# Patient Record
Sex: Female | Born: 1990 | Race: White | Hispanic: No | Marital: Single | Smoking: Former smoker
Health system: Southern US, Community
[De-identification: ages and names within clinical notes are randomized; demographics above are authoritative.]

## PROBLEM LIST (undated history)

## (undated) HISTORY — PX: TYMPANOSTOMY TUBE PLACEMENT: SHX32

---
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
## 2011-01-13 ENCOUNTER — Encounter: Payer: Self-pay | Admitting: Family Medicine

## 2011-01-13 ENCOUNTER — Ambulatory Visit (INDEPENDENT_AMBULATORY_CARE_PROVIDER_SITE_OTHER): Payer: Managed Care, Other (non HMO) | Admitting: Family Medicine

## 2011-01-13 DIAGNOSIS — T148XXA Other injury of unspecified body region, initial encounter: Secondary | ICD-10-CM

## 2011-01-13 DIAGNOSIS — W540XXA Bitten by dog, initial encounter: Secondary | ICD-10-CM

## 2011-01-14 ENCOUNTER — Encounter: Payer: Self-pay | Admitting: Family Medicine

## 2011-01-16 ENCOUNTER — Telehealth (INDEPENDENT_AMBULATORY_CARE_PROVIDER_SITE_OTHER): Payer: Self-pay

## 2011-01-18 ENCOUNTER — Encounter: Payer: Self-pay | Admitting: Family Medicine

## 2011-01-19 NOTE — Assessment & Plan Note (Signed)
Summary: DOG BITE/WSE   Vital Signs:  Patient Profile:   20 Years Old Female CC:      dog bite to LFA x tonight Height:     63 inches Weight:      134 pounds O2 Sat:      100 % O2 treatment:    Room Air Temp:     98.7 degrees F oral Pulse rate:   106 / minute Resp:     18 per minute BP sitting:   146 / 107  (right arm) Cuff size:   regular  Vitals Entered By: Lajean Saver RN (January 13, 2011 7:52 PM)                  Updated Prior Medication List: NUVARING 0.12-0.015 MG/24HR RING (ETONOGESTREL-ETHINYL ESTRADIOL) once monthly  Current Allergies: No known allergies History of Present Illness Chief Complaint: dog bite to LFA x tonight History of Present Illness:  Subjective:  Patient complains of dog bite to left forearm this afternoon.  Dog was not sick, and has current rabies vaccination.  Patient's last tetanus shot was about 3 years ago.  REVIEW OF SYSTEMS Constitutional Symptoms      Denies fever, chills, night sweats, weight loss, weight gain, and fatigue.  Eyes       Denies change in vision, eye pain, eye discharge, glasses, contact lenses, and eye surgery. Ear/Nose/Throat/Mouth       Denies hearing loss/aids, change in hearing, ear pain, ear discharge, dizziness, frequent runny nose, frequent nose bleeds, sinus problems, sore throat, hoarseness, and tooth pain or bleeding.  Respiratory       Denies dry cough, productive cough, wheezing, shortness of breath, asthma, bronchitis, and emphysema/COPD.  Cardiovascular       Denies murmurs, chest pain, and tires easily with exhertion.    Gastrointestinal       Denies stomach pain, nausea/vomiting, diarrhea, constipation, blood in bowel movements, and indigestion. Genitourniary       Denies painful urination, kidney stones, and loss of urinary control. Neurological       Denies paralysis, seizures, and fainting/blackouts. Musculoskeletal       Denies muscle pain, joint pain, joint stiffness, decreased range of  motion, redness, swelling, muscle weakness, and gout.  Skin       Denies bruising, unusual mles/lumps or sores, and hair/skin or nail changes.  Psych       Denies mood changes, temper/anger issues, anxiety/stress, speech problems, depression, and sleep problems. Other Comments: Patient was bitten by a dog tonight on her LFA. Small puncture wound present, bleeding has stopped. Dog owner say the dog is up to date on his rabies and the patient had a TDaP 2 years ago.   Past History:  Past Medical History: Unremarkable  Past Surgical History: adenoids  Family History: Family History Hypertension  Social History: Occupation: Consulting civil engineer Single Current Smoker Alcohol use-no Drug use-no Smoking Status:  current Drug Use:  no   Objective:  Appearance:  Patient appears healthy, stated age, and in no acute distress  Left forearm:  8mm simple superficial laceration dorsal surface.  Mild surrounding tenderness.  Wound clean without debris.  Distal neurovascular intact  Assessment New Problems: LACERATION (ICD-879.8) DOG BITE (ICD-E906.0)   Plan New Medications/Changes: AUGMENTIN 875-125 MG TABS (AMOXICILLIN-POT CLAVULANATE) One by mouth q12hr pc  #20 x 0, 01/13/2011, Donna Christen MD  New Orders: New Patient Level III [id number] Repair Superficial Wound(s) 2.5cm or <(scalp,neck,axillae,ext gent,trunk,extre) [12001] Planning Comments:   Begin Augmentin.  Keep wound clean and dry; change bandage daily.  Return for signs of infection. Suture removal 10 days.   The patient and/or caregiver has been counseled thoroughly with regard to medications prescribed including dosage, schedule, interactions, rationale for use, and possible side effects and they verbalize understanding.  Diagnoses and expected course of recovery discussed and will return if not improved as expected or if the condition worsens. Patient and/or caregiver verbalized understanding.   PROCEDURE:  Suture Site: Left  forearm Size: 8mm Number of Lacerations: One Anesthesia: 1% Lidocaine without epinephrine Procedure: Procedure:  Laceration Repair Discussed benefits and risks of procedure and verbal consent obtained. Using sterile technique and local 1% lidocaine without epinephrine, cleansed wound with Betadine followed by copious lavage with normal saline.  Wound carefully inspected for debris and foreign bodies; none found.  Wound closed loosely with #1 , 4-0 interrupted nylon sutures.  Bacitracin and non-stick sterile dressing applied.  Wound precautions explained to patient.    Disposition: Home Prescriptions: AUGMENTIN 875-125 MG TABS (AMOXICILLIN-POT CLAVULANATE) One by mouth q12hr pc  #20 x 0   Entered and Authorized by:   Donna Christen MD   Signed by:   Donna Christen MD on 01/13/2011   Method used:   Print then Give to Patient   RxID:   [id number]   Orders Added: 1)  New Patient Level III [40102] 2)  Repair Superficial Wound(s) 2.5cm or <(scalp,neck,axillae,ext gent,trunk,extre) [12001]

## 2011-01-19 NOTE — Miscellaneous (Signed)
Summary: Immunizations  Immunizations   Imported By: Haskell Riling 01/14/2011 08:09:04  _____________________________________________________________________  External Attachment:    Type:   Image     Comment:   External Document

## 2011-01-19 NOTE — Letter (Signed)
Summary: Out of Work  [facility]  [address]   [place], [place] [address]   Phone: [phone]  Fax: [phone]    January 13, 2011   Employee:  DELLAR TRABER    To Whom It May Concern:   For Medical reasons, please excuse the above named employee from using her left arm for one week.      If you need additional information, please feel free to contact our office.         Sincerely,    Donna Christen MD

## 2011-01-19 NOTE — Miscellaneous (Signed)
Summary: Immunizations  Immunizations   Imported By: Haskell Riling 01/14/2011 08:11:13  _____________________________________________________________________  External Attachment:    Type:   Image     Comment:   External Document

## 2011-01-23 ENCOUNTER — Encounter: Payer: Self-pay | Admitting: Family Medicine

## 2011-01-23 ENCOUNTER — Inpatient Hospital Stay (INDEPENDENT_AMBULATORY_CARE_PROVIDER_SITE_OTHER)
Admission: RE | Admit: 2011-01-23 | Discharge: 2011-01-23 | Disposition: A | Discharge status: Home / Self Care | Payer: Commercial Indemnity | Source: Ambulatory Visit | Attending: Family Medicine | Admitting: Family Medicine

## 2011-01-23 DIAGNOSIS — T148XXA Other injury of unspecified body region, initial encounter: Secondary | ICD-10-CM

## 2011-01-23 DIAGNOSIS — Z4802 Encounter for removal of sutures: Secondary | ICD-10-CM

## 2011-01-23 DIAGNOSIS — W540XXA Bitten by dog, initial encounter: Secondary | ICD-10-CM

## 2011-01-26 NOTE — Assessment & Plan Note (Signed)
Summary: suture removal/TM      Subjective:  Patient returns for suture removal.  No complaints  Objective: Left forearm:  laceration shows no signs of infection, well healed  Assessment:  ENCOUNTER FOR REMOVAL OF SUTURES (ICD-V58.32)   Plan:   Suture removed.  Applied Bacitracin and bandaid.  Apply bandage daily for one to two days.    Current Allergies: No known allergies  Assessment  Assessed LACERATION as improved - Donna Christen MD New Problems: ENCOUNTER FOR REMOVAL OF SUTURES (ICD-V58.32)   The patient and/or caregiver has been counseled thoroughly with regard to medications prescribed including dosage, schedule, interactions, rationale for use, and possible side effects and they verbalize understanding.  Diagnoses and expected course of recovery discussed and will return if not improved as expected or if the condition worsens. Patient and/or caregiver verbalized understanding.   Orders Added: 1)  No Charge Patient Arrived (NCPA0) [NCPA0]

## 2011-01-26 NOTE — Letter (Signed)
Summary: ANIMAL BITE FORM  ANIMAL BITE FORM   Imported By: Tawni Carnes 01/18/2011 18:44:10  _____________________________________________________________________  External Attachment:    Type:   Image     Comment:   External Document

## 2011-01-26 NOTE — Progress Notes (Signed)
  Phone Note Outgoing Call   Call placed by: Linton Flemings RN,  January 16, 2011 4:03 PM Call placed to: Patient Summary of Call: called with no answer and unable to leave message

## 2011-11-11 ENCOUNTER — Ambulatory Visit: Payer: Managed Care, Other (non HMO)

## 2011-11-11 DIAGNOSIS — R059 Cough, unspecified: Secondary | ICD-10-CM

## 2011-11-11 DIAGNOSIS — H6990 Unspecified Eustachian tube disorder, unspecified ear: Secondary | ICD-10-CM

## 2011-11-11 DIAGNOSIS — H66009 Acute suppurative otitis media without spontaneous rupture of ear drum, unspecified ear: Secondary | ICD-10-CM

## 2011-11-11 DIAGNOSIS — R05 Cough: Secondary | ICD-10-CM

## 2011-11-11 DIAGNOSIS — H6980 Other specified disorders of Eustachian tube, unspecified ear: Secondary | ICD-10-CM

## 2011-12-18 ENCOUNTER — Encounter: Payer: Self-pay | Admitting: Family Medicine

## 2011-12-18 ENCOUNTER — Ambulatory Visit (INDEPENDENT_AMBULATORY_CARE_PROVIDER_SITE_OTHER): Payer: Managed Care, Other (non HMO) | Admitting: Family Medicine

## 2011-12-18 DIAGNOSIS — H66019 Acute suppurative otitis media with spontaneous rupture of ear drum, unspecified ear: Secondary | ICD-10-CM

## 2011-12-18 DIAGNOSIS — IMO0001 Reserved for inherently not codable concepts without codable children: Secondary | ICD-10-CM

## 2011-12-18 DIAGNOSIS — H6690 Otitis media, unspecified, unspecified ear: Secondary | ICD-10-CM

## 2011-12-18 MED ORDER — AMOXICILLIN 875 MG PO TABS: 875.0000 mg | ORAL_TABLET | Freq: Two times a day (BID) | ORAL | Status: AC

## 2011-12-18 MED ORDER — MOMETASONE FUROATE 50 MCG/ACT NA SUSP: 2.0000 | Freq: Every day | NASAL | Status: DC

## 2011-12-18 MED ORDER — HYDROCODONE-HOMATROPINE 5-1.5 MG/5ML PO SYRP: 5.0000 mL | ORAL_SOLUTION | Freq: Four times a day (QID) | ORAL | Status: AC | PRN

## 2011-12-18 NOTE — Progress Notes (Signed)
This is a 21 year old woman with a half a week of progressive cough congestion sore throat and fever. She works at [employer]. Both ears feel clogged. She has a history of recurrent otitis media.  Objective: No acute distress, alert and cooperative. Skin dry and warm.  HEENT: Erythematous posterior pharynx, retracted and distorted TMs bilaterally.  Neck: Supple no adenopathy  Chest: Scattered rhonchi  Heart: regular no murmur    Assessment: Acute upper respiratory infection with otitis media and cough

## 2012-01-22 ENCOUNTER — Ambulatory Visit: Payer: Managed Care, Other (non HMO) | Admitting: Family Medicine

## 2012-01-22 VITALS — BP 121/83 | HR 96 | Temp 97.8°F | Resp 16 | Ht 63.0 in | Wt 124.0 lb

## 2012-01-22 DIAGNOSIS — Z3049 Encounter for surveillance of other contraceptives: Secondary | ICD-10-CM

## 2012-01-22 DIAGNOSIS — IMO0001 Reserved for inherently not codable concepts without codable children: Secondary | ICD-10-CM

## 2012-01-22 LAB — POCT UA - MICROSCOPIC ONLY
Mucus, UA: NEGATIVE
RBC, urine, microscopic: NEGATIVE

## 2012-01-22 MED ORDER — ETONOGESTREL-ETHINYL ESTRADIOL 0.12-0.015 MG/24HR VA RING: VAGINAL_RING | VAGINAL | Status: DC

## 2012-01-22 NOTE — Progress Notes (Signed)
  Subjective:    Patient ID: Anne Franco, female    DOB: [birth year], 21 y.o.   MRN: [id number]  HPI Patient presents for contraception refill. LMP 01/16/12  Has done well with Nuva RIng without breakthrough bleeding or other side effects  Monogmonous relationship Review of Systems  Genitourinary: Negative for dysuria, vaginal bleeding, vaginal discharge, genital sores and pelvic pain.       Objective:   Physical Exam  Constitutional: She appears well-developed and well-nourished.  Neck: Neck supple. No thyromegaly present.  Cardiovascular: Normal rate, regular rhythm and normal heart sounds.   Pulmonary/Chest: Effort normal and breath sounds normal.  Abdominal: Soft. Bowel sounds are normal.  Genitourinary: Vagina normal and uterus normal. Cervix exhibits discharge. Cervix exhibits no motion tenderness. Right adnexum displays no mass. Left adnexum displays no mass. No vaginal discharge found.  Neurological: She is alert.  Skin: Skin is warm.  Psychiatric: She has a normal mood and affect.     . Results for orders placed in visit on 01/22/12  POCT UA - MICROSCOPIC ONLY      Component Value Range   WBC, Ur, HPF, POC 0-2     RBC, urine, microscopic neg     Bacteria, U Microscopic neg     Mucus, UA neg     Epithelial cells, urine per micros 1-4     Crystals, Ur, HPF, POC neg     Casts, Ur, LPF, POC neg     Yeast, UA neg    POCT URINE PREGNANCY      Component Value Range   Preg Test, Ur Negative          Assessment & Plan:   1. Birth control  Pap IG, CT/NG w/ reflex HPV when ASC-U, etonogestrel-ethinyl estradiol (NUVARING) 0.12-0.015 MG/24HR vaginal ring, POCT UA - Microscopic Only, POCT urine pregnancy   Anticipatory guidance

## 2012-01-25 LAB — PAP IG, CT-NG, RFX HPV ASCU
Chlamydia Probe Amp: NEGATIVE
GC Probe Amp: NEGATIVE

## 2012-01-26 ENCOUNTER — Encounter: Payer: Self-pay | Admitting: *Deleted

## 2013-01-03 ENCOUNTER — Ambulatory Visit: Payer: Managed Care, Other (non HMO) | Admitting: Family Medicine

## 2013-01-03 VITALS — BP 120/84 | HR 86 | Temp 98.5°F | Resp 16 | Ht 63.0 in | Wt 140.0 lb

## 2013-01-03 DIAGNOSIS — J029 Acute pharyngitis, unspecified: Secondary | ICD-10-CM

## 2013-01-03 DIAGNOSIS — Z3009 Encounter for other general counseling and advice on contraception: Secondary | ICD-10-CM

## 2013-01-03 MED ORDER — ETONOGESTREL-ETHINYL ESTRADIOL 0.12-0.015 MG/24HR VA RING: VAGINAL_RING | VAGINAL | Status: DC

## 2013-01-03 NOTE — Patient Instructions (Signed)
Take mucinex for cough, saline nasal spray for nasal congestion, and use throat lozenges for sore throat. I start running a fever or symptoms getting worse come back in to be re-evaluated or to ER.

## 2013-01-03 NOTE — Progress Notes (Signed)
  Subjective:    Patient ID: Anne Franco, female    DOB: [birth year], 22 y.o.   MRN: [id number]  HPI  Anne Franco is a 22 y.o. female  Started with sore throat - 2 nights ago. Yesterday am - cough and runny nose. No fever, but has had strep throat in past without fever.   Some sick contacts in class - cosmetology.   Did have flu vaccine this year.  Tx: dayquil, nyquil.   Review of Systems  Constitutional: Negative for fever and chills.  HENT: Positive for congestion, sore throat, rhinorrhea and sneezing.   Respiratory: Positive for cough. Negative for shortness of breath and wheezing.        Objective:   Physical Exam  Vitals reviewed. Constitutional: She is oriented to person, place, and time. She appears well-developed and well-nourished. No distress.  HENT:  Head: Normocephalic and atraumatic.  Right Ear: Hearing, tympanic membrane, external ear and ear canal normal.  Left Ear: Hearing, tympanic membrane, external ear and ear canal normal.  Nose: Nose normal.  Mouth/Throat: Oropharynx is clear and moist. No oropharyngeal exudate.  Eyes: Conjunctivae and EOM are normal. Pupils are equal, round, and reactive to light.  Cardiovascular: Normal rate, regular rhythm, normal heart sounds and intact distal pulses.   No murmur heard. Pulmonary/Chest: Effort normal and breath sounds normal. No respiratory distress. She has no wheezes. She has no rhonchi.  Lymphadenopathy:    She has no cervical adenopathy.  Neurological: She is alert and oriented to person, place, and time.  Skin: Skin is warm and dry. No rash noted.  Psychiatric: She has a normal mood and affect. Her behavior is normal.    Results for orders placed in visit on 01/03/13  POCT RAPID STREP A (OFFICE)      Result Value Range   Rapid Strep A Screen Negative  Negative        Assessment & Plan:  Anne Franco is a 22 y.o. female  Acute pharyngitis - Plan: POCT rapid strep A, Culture, Group A  Strep  Likely viral URI.  Sx care discussed. - mucinex, saline ns, rtc precautions given.   At end of visit - Ran out of birth control - last nuvaring removed last Sunday (on schedule).  Last pap March of last year - normal.  Refilled nuvaring and 3 rf's, but needs ov for discussion of pap testing and contraception in next 3 months - understanding expressed.   Patient Instructions  Take mucinex for cough, saline nasal spray for nasal congestion, and use throat lozenges for sore throat. I start running a fever or symptoms getting worse come back in to be re-evaluated or to ER.

## 2013-04-15 ENCOUNTER — Other Ambulatory Visit: Payer: Self-pay | Admitting: Family Medicine

## 2013-04-19 ENCOUNTER — Ambulatory Visit: Payer: Managed Care, Other (non HMO) | Admitting: Family Medicine

## 2013-04-19 VITALS — BP 110/80 | HR 70 | Temp 98.0°F | Resp 18 | Wt 138.0 lb

## 2013-04-19 DIAGNOSIS — J309 Allergic rhinitis, unspecified: Secondary | ICD-10-CM

## 2013-04-19 DIAGNOSIS — IMO0001 Reserved for inherently not codable concepts without codable children: Secondary | ICD-10-CM

## 2013-04-19 DIAGNOSIS — J069 Acute upper respiratory infection, unspecified: Secondary | ICD-10-CM

## 2013-04-19 DIAGNOSIS — Z309 Encounter for contraceptive management, unspecified: Secondary | ICD-10-CM

## 2013-04-19 MED ORDER — ETONOGESTREL-ETHINYL ESTRADIOL 0.12-0.015 MG/24HR VA RING: VAGINAL_RING | VAGINAL | Status: DC

## 2013-04-19 MED ORDER — IPRATROPIUM BROMIDE 0.03 % NA SOLN: 2.0000 | Freq: Four times a day (QID) | NASAL | Status: DC

## 2013-04-19 MED ORDER — FLUTICASONE PROPIONATE 50 MCG/ACT NA SUSP: 2.0000 | Freq: Every day | NASAL | Status: DC

## 2013-04-19 NOTE — Progress Notes (Signed)
Subjective:    Patient ID: Anne Franco, female    DOB: May 29, 1991, 22 y.o.   MRN: [id number] Chief Complaint  Patient presents with  . URI    HPI  Having nasal congestion for about 10d - thought it was just allergies but yesterday it developed bloody discharge and then this morning her entire face felt swollen. No fever/chills, no cough, no sore throat, rare post-nasal drip.  Tried some flonase and atrovent that she had at home but ran out of them.  Started them in the winter and they helped but has been out for a while. No jaw pain or teeth pain, did have a HA this morning. No GI/GU sxs, no CP, no SHoB.   History reviewed. No pertinent past medical history. No current outpatient prescriptions on file prior to visit.   No current facility-administered medications on file prior to visit.   No Known Allergies    Review of Systems  Constitutional: Positive for fatigue. Negative for fever, chills, diaphoresis, activity change and appetite change.  HENT: Positive for nosebleeds, congestion, rhinorrhea, postnasal drip and sinus pressure. Negative for ear pain, sore throat, trouble swallowing, neck pain, neck stiffness, voice change and ear discharge.   Eyes: Negative for discharge and itching.  Respiratory: Negative for cough, shortness of breath and wheezing.   Cardiovascular: Negative for chest pain.  Gastrointestinal: Negative for nausea, vomiting, abdominal pain and diarrhea.  Genitourinary: Negative for dysuria and decreased urine volume.  Skin: Negative for rash.  Neurological: Positive for headaches. Negative for dizziness and syncope.  Hematological: Negative for adenopathy.  Psychiatric/Behavioral: Negative for sleep disturbance.      BP 110/80  Pulse 70  Temp(Src) 98 F (36.7 C) (Oral)  Resp 18  Wt 138 lb (62.596 kg)  BMI 24.45 kg/m2  LMP 04/05/2013 Objective:   Physical Exam  Constitutional: She is oriented to person, place, and time. She appears well-developed  and well-nourished. She does not appear ill. No distress.  HENT:  Head: Normocephalic and atraumatic.  Right Ear: External ear and ear canal normal. Tympanic membrane is scarred.  Left Ear: External ear and ear canal normal. Tympanic membrane is retracted. A middle ear effusion is present.  Nose: Mucosal edema and rhinorrhea present.  Mouth/Throat: Uvula is midline and mucous membranes are normal. Posterior oropharyngeal erythema present. No oropharyngeal exudate, posterior oropharyngeal edema or tonsillar abscesses.  Eyes: Conjunctivae are normal. Right eye exhibits no discharge. Left eye exhibits no discharge. No scleral icterus.  Neck: Normal range of motion. Neck supple.  Cardiovascular: Normal rate, regular rhythm, normal heart sounds and intact distal pulses.   Pulmonary/Chest: Effort normal and breath sounds normal.  Lymphadenopathy:       Head (right side): No submandibular, no preauricular and no posterior auricular adenopathy present.       Head (left side): No submandibular, no preauricular and no posterior auricular adenopathy present.    She has no cervical adenopathy.       Right: No supraclavicular adenopathy present.       Left: No supraclavicular adenopathy present.  Neurological: She is alert and oriented to person, place, and time.  Skin: Skin is warm and dry. She is not diaphoretic. No erythema.  Psychiatric: She has a normal mood and affect. Her behavior is normal.          Assessment & Plan:  Birth control - Plan: etonogestrel-ethinyl estradiol (NUVARING) 0.12-0.015 MG/24HR vaginal ring. Pap nml and in a long term monogamous relationship. Repeat pap 01/2015.  URI (upper respiratory infection)  Allergic rhinitis  Meds ordered this encounter  Medications  . etonogestrel-ethinyl estradiol (NUVARING) 0.12-0.015 MG/24HR vaginal ring    Sig: UAD    Dispense:  1 each    Refill:  11  . ipratropium (ATROVENT) 0.03 % nasal spray    Sig: Place 2 sprays into the nose  4 (four) times daily.    Dispense:  30 mL    Refill:  1  . fluticasone (FLONASE) 50 MCG/ACT nasal spray    Sig: Place 2 sprays into the nose daily.    Dispense:  16 g    Refill:  6

## 2013-04-19 NOTE — Patient Instructions (Addendum)
Hot showers or breathing in steam may help loosen the congestion.  Using a netti pot or sinus rinse is also likely to help you feel better and keep this from progressing.  Use the atrovent nasal spray as needed throughout the day and use the fluticasone nasal spray every night before bed for at least 2 weeks.  I recommend augmenting with 12 hr sudafed (behind the counter) and generic mucinex to help you move out the congestion.  If no improvement or you are getting worse, come back as you might need a course of antibiotics and steroids but hopefully with all of the above, you can avoid it.

## 2013-10-16 ENCOUNTER — Ambulatory Visit: Payer: Managed Care, Other (non HMO) | Admitting: Emergency Medicine

## 2013-10-16 VITALS — BP 108/72 | HR 102 | Temp 97.2°F | Resp 18 | Ht 63.5 in | Wt 142.0 lb

## 2013-10-16 DIAGNOSIS — B079 Viral wart, unspecified: Secondary | ICD-10-CM

## 2013-10-16 DIAGNOSIS — J018 Other acute sinusitis: Secondary | ICD-10-CM

## 2013-10-16 DIAGNOSIS — J209 Acute bronchitis, unspecified: Secondary | ICD-10-CM

## 2013-10-16 MED ORDER — AMOXICILLIN-POT CLAVULANATE 875-125 MG PO TABS: 1.0000 | ORAL_TABLET | Freq: Two times a day (BID) | ORAL | Status: DC

## 2013-10-16 MED ORDER — GUAIFENESIN-DM 100-10 MG/5ML PO SYRP: 5.0000 mL | ORAL_SOLUTION | ORAL | Status: DC | PRN

## 2013-10-16 MED ORDER — PSEUDOEPHEDRINE-GUAIFENESIN ER 60-600 MG PO TB12: 1.0000 | ORAL_TABLET | Freq: Two times a day (BID) | ORAL | Status: DC

## 2013-10-16 NOTE — Patient Instructions (Signed)
Temporomandibular Problems  Temporomandibular joint (TMJ) dysfunction means there are problems with the joint between your jaw and your skull. This is a joint lined by cartilage like other joints in your body but also has a small disc in the joint which keeps the bones from rubbing on each other. These joints are like other joints and can get inflamed (sore) from arthritis and other problems. When this joint gets sore, it can cause headaches and pain in the jaw and the face. CAUSES  Usually the arthritic types of problems are caused by soreness in the joint. Soreness in the joint can also be caused by overuse. This may come from grinding your teeth. It may also come from mis-alignment in the joint. DIAGNOSIS Diagnosis of this condition can often be made by history and exam. Sometimes your caregiver may need X-rays or an MRI scan to determine the exact cause. It may be necessary to see your dentist to determine if your teeth and jaws are lined up correctly. TREATMENT  Most of the time this problem is not serious; however, sometimes it can persist (become chronic). When this happens medications that will cut down on inflammation (soreness) help. Sometimes a shot of cortisone into the joint will be helpful. If your teeth are not aligned it may help for your dentist to make a splint for your mouth that can help this problem. If no physical problems can be found, the problem may come from tension. If tension is found to be the cause, biofeedback or relaxation techniques may be helpful. HOME CARE INSTRUCTIONS   Later in the day, applications of ice packs may be helpful. Ice can be used in a plastic bag with a towel around it to prevent frostbite to skin. This may be used about every 2 hours for 20 to 30 minutes, as needed while awake, or as directed by your caregiver.  Only take over-the-counter or prescription medicines for pain, discomfort, or fever as directed by your caregiver.  If physical therapy was  prescribed, follow your caregiver's directions.  Wear mouth appliances as directed if they were given. Document Released: 07/20/2001 Document Revised: 01/17/2012 Document Reviewed: 10/27/2008 [facility] Patient Information 2014 [place], [place]. Bronchitis Bronchitis is the body's way of reacting to injury and/or infection (inflammation) of the bronchi. Bronchi are the air tubes that extend from the windpipe into the lungs. If the inflammation becomes severe, it may cause shortness of breath. CAUSES  Inflammation may be caused by:  A virus.  Germs (bacteria).  Dust.  Allergens.  Pollutants and many other irritants. The cells lining the bronchial tree are covered with tiny hairs (cilia). These constantly beat upward, away from the lungs, toward the mouth. This keeps the lungs free of pollutants. When these cells become too irritated and are unable to do their job, mucus begins to develop. This causes the characteristic cough of bronchitis. The cough clears the lungs when the cilia are unable to do their job. Without either of these protective mechanisms, the mucus would settle in the lungs. Then you would develop pneumonia. Smoking is a common cause of bronchitis and can contribute to pneumonia. Stopping this habit is the single most important thing you can do to help yourself. TREATMENT   Your caregiver may prescribe an antibiotic if the cough is caused by bacteria. Also, medicines that open up your airways make it easier to breathe. Your caregiver may also recommend or prescribe an expectorant. It will loosen the mucus to be coughed up. Only take over-the-counter  or prescription medicines for pain, discomfort, or fever as directed by your caregiver.  Removing whatever causes the problem (smoking, for example) is critical to preventing the problem from getting worse.  Cough suppressants may be prescribed for relief of cough symptoms.  Inhaled medicines may be prescribed to help with  symptoms now and to help prevent problems from returning.  For those with recurrent (chronic) bronchitis, there may be a need for steroid medicines. SEEK IMMEDIATE MEDICAL CARE IF:   During treatment, you develop more pus-like mucus (purulent sputum).  You have a fever.  You become progressively more ill.  You have increased difficulty breathing, wheezing, or shortness of breath. It is necessary to seek immediate medical care if you are elderly or sick from any other disease. MAKE SURE YOU:   Understand these instructions.  Will watch your condition.  Will get help right away if you are not doing well or get worse. Document Released: 10/25/2005 Document Revised: 06/27/2013 Document Reviewed: 06/19/2013 [facility] Patient Information 2014 [place], [place]. Sinusitis Sinusitis is redness, soreness, and swelling (inflammation) of the paranasal sinuses. Paranasal sinuses are air pockets within the bones of your face (beneath the eyes, the middle of the forehead, or above the eyes). In healthy paranasal sinuses, mucus is able to drain out, and air is able to circulate through them by way of your nose. However, when your paranasal sinuses are inflamed, mucus and air can become trapped. This can allow bacteria and other germs to grow and cause infection. Sinusitis can develop quickly and last only a short time (acute) or continue over a long period (chronic). Sinusitis that lasts for more than 12 weeks is considered chronic.  CAUSES  Causes of sinusitis include:  Allergies.  Structural abnormalities, such as displacement of the cartilage that separates your nostrils (deviated septum), which can decrease the air flow through your nose and sinuses and affect sinus drainage.  Functional abnormalities, such as when the small hairs (cilia) that line your sinuses and help remove mucus do not work properly or are not present. SYMPTOMS  Symptoms of acute and chronic sinusitis are the same. The  primary symptoms are pain and pressure around the affected sinuses. Other symptoms include:  Upper toothache.  Earache.  Headache.  Bad breath.  Decreased sense of smell and taste.  A cough, which worsens when you are lying flat.  Fatigue.  Fever.  Thick drainage from your nose, which often is green and may contain pus (purulent).  Swelling and warmth over the affected sinuses. DIAGNOSIS  Your caregiver will perform a physical exam. During the exam, your caregiver may:  Look in your nose for signs of abnormal growths in your nostrils (nasal polyps).  Tap over the affected sinus to check for signs of infection.  View the inside of your sinuses (endoscopy) with a special imaging device with a light attached (endoscope), which is inserted into your sinuses. If your caregiver suspects that you have chronic sinusitis, one or more of the following tests may be recommended:  Allergy tests.  Nasal culture A sample of mucus is taken from your nose and sent to a lab and screened for bacteria.  Nasal cytology A sample of mucus is taken from your nose and examined by your caregiver to determine if your sinusitis is related to an allergy. TREATMENT  Most cases of acute sinusitis are related to a viral infection and will resolve on their own within 10 days. Sometimes medicines are prescribed to help relieve symptoms (pain medicine,  decongestants, nasal steroid sprays, or saline sprays).  However, for sinusitis related to a bacterial infection, your caregiver will prescribe antibiotic medicines. These are medicines that will help kill the bacteria causing the infection.  Rarely, sinusitis is caused by a fungal infection. In theses cases, your caregiver will prescribe antifungal medicine. For some cases of chronic sinusitis, surgery is needed. Generally, these are cases in which sinusitis recurs more than 3 times per year, despite other treatments. HOME CARE INSTRUCTIONS   Drink plenty of  water. Water helps thin the mucus so your sinuses can drain more easily.  Use a humidifier.  Inhale steam 3 to 4 times a day (for example, sit in the bathroom with the shower running).  Apply a warm, moist washcloth to your face 3 to 4 times a day, or as directed by your caregiver.  Use saline nasal sprays to help moisten and clean your sinuses.  Take over-the-counter or prescription medicines for pain, discomfort, or fever only as directed by your caregiver. SEEK IMMEDIATE MEDICAL CARE IF:  You have increasing pain or severe headaches.  You have nausea, vomiting, or drowsiness.  You have swelling around your face.  You have vision problems.  You have a stiff neck.  You have difficulty breathing. MAKE SURE YOU:   Understand these instructions.  Will watch your condition.  Will get help right away if you are not doing well or get worse. Document Released: 10/25/2005 Document Revised: 01/17/2012 Document Reviewed: 11/09/2011 [facility] Patient Information 2014 [place], [place].

## 2013-10-16 NOTE — Progress Notes (Signed)
[facility] [address] [place] [address] [phone]  Date:  10/16/2013   Name:  Anne Franco   DOB:  07-Jun-1991   MRN:  [id number]  PCP:  Provider Not In System    Chief Complaint: Sinusitis and wart on rt index finger   History of Present Illness:  Anne Franco is a 22 y.o. very pleasant female patient who presents with the following:  Ill for 2 weeks with purulent nasal drainage, post nasal drip and congestion.  She has a sore throat and a cough productive of scant purulent sputum.  No fever or chills.  No nausea or vomiting or stool change.  No rash.  Has exertional shortness of breath. History of prior sinus infections.  Pressure in both cheeks.  Now has pain in jaws on arising.  No history of TMJ.  No injury or overuse.  No improvement with over the counter medications or other home remedies. Denies other complaint or health concern today.   Patient Active Problem List   Diagnosis Date Noted  . Contraception 12/18/2011  . Recurrent otitis media 12/18/2011    History reviewed. No pertinent past medical history.  History reviewed. No pertinent past surgical history.  History  Substance Use Topics  . Smoking status: Current Some Day Smoker  . Smokeless tobacco: Not on file  . Alcohol Use: Yes    Family History  Problem Relation Age of Onset  . Hyperlipidemia Mother   . Hypertension Mother   . Prostate cancer Father     No Known Allergies  Medication list has been reviewed and updated.  Current Outpatient Prescriptions on File Prior to Visit  Medication Sig Dispense Refill  . etonogestrel-ethinyl estradiol (NUVARING) 0.12-0.015 MG/24HR vaginal ring UAD  1 each  11  . ipratropium (ATROVENT) 0.03 % nasal spray Place 2 sprays into the nose 4 (four) times daily.  30 mL  1  . fluticasone (FLONASE) 50 MCG/ACT nasal spray Place 2 sprays into the nose daily.  16 g  6   No current facility-administered medications on file prior to  visit.    Review of Systems:  As per HPI, otherwise negative.    Physical Examination: Filed Vitals:   10/16/13 1524  BP: 108/72  Pulse: 102  Temp: 97.2 F (36.2 C)  Resp: 18   Filed Vitals:   10/16/13 1524  Height: 5' 3.5" (1.613 m)  Weight: 142 lb (64.411 kg)   Body mass index is 24.76 kg/(m^2). Ideal Body Weight: Weight in (lb) to have BMI = 25: 143.1  GEN: WDWN, NAD, Non-toxic, A & O x 3 HEENT: Atraumatic, Normocephalic. Neck supple. No masses, No LAD. Ears and Nose: No external deformity. CV: RRR, No M/G/R. No JVD. No thrill. No extra heart sounds. PULM: CTA B, no wheezes, crackles, rhonchi. No retractions. No resp. distress. No accessory muscle use. ABD: S, NT, ND, +BS. No rebound. No HSM. EXTR: No c/c/e NEURO Normal gait.  PSYCH: Normally interactive. Conversant. Not depressed or anxious appearing.  Calm demeanor.    Assessment and Plan: Sinusitis Bronchitis augmentin mucinex d Robitussin dm Wart right second finger  Signed,  Phillips Odor, MD   Right index finger has moderate size wart not responding to home remedies.  Treated 45 seconds with N2.

## 2014-04-17 ENCOUNTER — Other Ambulatory Visit: Payer: Self-pay | Admitting: Family Medicine

## 2014-05-30 ENCOUNTER — Ambulatory Visit (INDEPENDENT_AMBULATORY_CARE_PROVIDER_SITE_OTHER): Payer: Managed Care, Other (non HMO) | Admitting: Family Medicine

## 2014-05-30 VITALS — BP 110/72 | HR 75 | Temp 98.1°F | Resp 16 | Ht 63.0 in | Wt 147.4 lb

## 2014-05-30 DIAGNOSIS — R87612 Low grade squamous intraepithelial lesion on cytologic smear of cervix (LGSIL): Secondary | ICD-10-CM

## 2014-05-30 DIAGNOSIS — Z3009 Encounter for other general counseling and advice on contraception: Secondary | ICD-10-CM

## 2014-05-30 DIAGNOSIS — Z124 Encounter for screening for malignant neoplasm of cervix: Secondary | ICD-10-CM

## 2014-05-30 MED ORDER — FLUTICASONE PROPIONATE 50 MCG/ACT NA SUSP: 2.0000 | Freq: Every day | NASAL | Status: DC

## 2014-05-30 MED ORDER — ETONOGESTREL-ETHINYL ESTRADIOL 0.12-0.015 MG/24HR VA RING: VAGINAL_RING | VAGINAL | Status: DC

## 2014-05-30 NOTE — Progress Notes (Signed)
Subjective:  This chart was scribed for Delman Cheadle, MD by Ladene Artist, ED Scribe. The patient was seen in room 1. Patient's care was started at 9:20 AM.   Patient ID: Anne Franco, female    DOB: 14-Sep-1991, 23 y.o.   MRN: [id number]  Chief Complaint  Patient presents with  . Medication Refill    birth control   HPI HPI Comments: Anne Franco is a 23 y.o. female who [facility] for [facility] control refill. Pt is due for repeat pap smear in 01/2015; prior pap in 2013 was normal, negative.  Pt denies vaginal discharge and any other symptoms or complications with Nuvaring. She reports long term relationship with her boyfriend and denies a change in sexual partners. Pt admits to occassionally smoking in social environments. She recently received a vapor as a gift and hopes that this will help her quit smoking. Pt tried BC pill packets a few years ago but states that she would forget to take the pills.   Pt currently works at [employer].   History reviewed. No pertinent past medical history. Current Outpatient Prescriptions on File Prior to Visit  Medication Sig Dispense Refill  . etonogestrel-ethinyl estradiol (NUVARING) 0.12-0.015 MG/24HR vaginal ring Insert nuvaring vaginally and leave in for 3 weeks. Then out for 1 wk. PATIENT NEEDS OFFICE VISIT FOR ADDITIONAL REFILLS  1 each  0  . fluticasone (FLONASE) 50 MCG/ACT nasal spray Place 2 sprays into the nose daily.  16 g  6   No current facility-administered medications on file prior to visit.   No Known Allergies  Review of Systems  Constitutional: Negative for fever, chills and fatigue.  Eyes: Negative for visual disturbance.  Respiratory: Negative for shortness of breath.   Cardiovascular: Negative for chest pain and palpitations.  Gastrointestinal: Negative for vomiting, abdominal pain and diarrhea.  Genitourinary: Negative for vaginal discharge.  Neurological: Negative for dizziness, syncope,  weakness and numbness.   Triage Vitals: BP 110/72  Pulse 75  Temp(Src) 98.1 F (36.7 C) (Oral)  Resp 16  Ht 5\' 3"  (1.6 m)  Wt 147 lb 6.4 oz (66.86 kg)  BMI 26.12 kg/m2  SpO2 98%  LMP 05/23/2014    Objective:   Physical Exam  Nursing note and vitals reviewed. Constitutional: She is oriented to person, place, and time. She appears well-developed and well-nourished.  HENT:  Head: Normocephalic and atraumatic.  Eyes: Conjunctivae and EOM are normal.  Neck: Neck supple.  Cardiovascular: Normal rate, regular rhythm and normal heart sounds.   Pulmonary/Chest: Effort normal.  Genitourinary: Vagina normal and uterus normal. Cervix exhibits discharge and friability. Cervix exhibits no motion tenderness.  Normal Labia Cervix with small amount of mucoid discharge; friable  Normal adnexa on exam  Musculoskeletal: Normal range of motion.  Neurological: She is alert and oriented to person, place, and time.  Skin: Skin is warm and dry.  Psychiatric: She has a normal mood and affect. Her behavior is normal.      Assessment & Plan:   Screening for cervical cancer - Plan: Pap IG, CT/NG w/ reflex HPV when ASC-U  Birth control counseling - Reviewed in detail risks of Nuvaring, including increased risk of DVT and PE. Pt does not want to change at this time so advised absolutely no smoking or second-hand and stay mobile. Declines STD testing as in monogamous relationship at this time.  Meds ordered this encounter  Medications  . etonogestrel-ethinyl estradiol (NUVARING) 0.12-0.015 MG/24HR vaginal ring  Sig: Insert nuvaring vaginally and leave in for 3 weeks. Then out for 1 wk. PATIENT NEEDS OFFICE VISIT FOR ADDITIONAL REFILLS    Dispense:  3 each    Refill:  4  . fluticasone (FLONASE) 50 MCG/ACT nasal spray    Sig: Place 2 sprays into both nostrils at bedtime.    Dispense:  16 g    Refill:  6    I personally performed the services described in this documentation, which was scribed in my  presence. The recorded information has been reviewed and considered, and addended by me as needed.  Delman Cheadle, MD MPH

## 2014-05-30 NOTE — Patient Instructions (Signed)
Contraception Choices Contraception (birth control) is the use of any methods or devices to prevent pregnancy. Below are some methods to help avoid pregnancy. HORMONAL METHODS   Contraceptive implant. This is a thin, plastic tube containing progesterone hormone. It does not contain estrogen hormone. Your health care provider inserts the tube in the inner part of the upper arm. The tube can remain in place for up to 3 years. After 3 years, the implant must be removed. The implant prevents the ovaries from releasing an egg (ovulation), thickens the cervical mucus to prevent sperm from entering the uterus, and thins the lining of the inside of the uterus.  Progesterone-only injections. These injections are given every 3 months by your health care provider to prevent pregnancy. This synthetic progesterone hormone stops the ovaries from releasing eggs. It also thickens cervical mucus and changes the uterine lining. This makes it harder for sperm to survive in the uterus.  Birth control pills. These pills contain estrogen and progesterone hormone. They work by preventing the ovaries from releasing eggs (ovulation). They also cause the cervical mucus to thicken, preventing the sperm from entering the uterus. Birth control pills are prescribed by a health care provider.Birth control pills can also be used to treat heavy periods.  Minipill. This type of birth control pill contains only the progesterone hormone. They are taken every day of each month and must be prescribed by your health care provider.  Birth control patch. The patch contains hormones similar to those in birth control pills. It must be changed once a week and is prescribed by a health care provider.  Vaginal ring. The ring contains hormones similar to those in birth control pills. It is left in the vagina for 3 weeks, removed for 1 week, and then a new one is put back in place. The patient must be comfortable inserting and removing the ring  from the vagina.A health care provider's prescription is necessary.  Emergency contraception. Emergency contraceptives prevent pregnancy after unprotected sexual intercourse. This pill can be taken right after sex or up to 5 days after unprotected sex. It is most effective the sooner you take the pills after having sexual intercourse. Most emergency contraceptive pills are available without a prescription. Check with your pharmacist. Do not use emergency contraception as your only form of birth control. BARRIER METHODS   Female condom. This is a thin sheath (latex or rubber) that is worn over the penis during sexual intercourse. It can be used with spermicide to increase effectiveness.  Female condom. This is a soft, loose-fitting sheath that is put into the vagina before sexual intercourse.  Diaphragm. This is a soft, latex, dome-shaped barrier that must be fitted by a health care provider. It is inserted into the vagina, along with a spermicidal jelly. It is inserted before intercourse. The diaphragm should be left in the vagina for 6 to 8 hours after intercourse.  Cervical cap. This is a round, soft, latex or plastic cup that fits over the cervix and must be fitted by a health care provider. The cap can be left in place for up to 48 hours after intercourse.  Sponge. This is a soft, circular piece of polyurethane foam. The sponge has spermicide in it. It is inserted into the vagina after wetting it and before sexual intercourse.  Spermicides. These are chemicals that kill or block sperm from entering the cervix and uterus. They come in the form of creams, jellies, suppositories, foam, or tablets. They do not require a   prescription. They are inserted into the vagina with an applicator before having sexual intercourse. The process must be repeated every time you have sexual intercourse. INTRAUTERINE CONTRACEPTION  Intrauterine device (IUD). This is a T-shaped device that is put in a woman's uterus  during a menstrual period to prevent pregnancy. There are 2 types:  Copper IUD. This type of IUD is wrapped in copper wire and is placed inside the uterus. Copper makes the uterus and fallopian tubes produce a fluid that kills sperm. It can stay in place for 10 years.  Hormone IUD. This type of IUD contains the hormone progestin (synthetic progesterone). The hormone thickens the cervical mucus and prevents sperm from entering the uterus, and it also thins the uterine lining to prevent implantation of a fertilized egg. The hormone can weaken or kill the sperm that get into the uterus. It can stay in place for 3-5 years, depending on which type of IUD is used. PERMANENT METHODS OF CONTRACEPTION  Female tubal ligation. This is when the woman's fallopian tubes are surgically sealed, tied, or blocked to prevent the egg from traveling to the uterus.  Hysteroscopic sterilization. This involves placing a small coil or insert into each fallopian tube. Your doctor uses a technique called hysteroscopy to do the procedure. The device causes scar tissue to form. This results in permanent blockage of the fallopian tubes, so the sperm cannot fertilize the egg. It takes about 3 months after the procedure for the tubes to become blocked. You must use another form of birth control for these 3 months.  Female sterilization. This is when the female has the tubes that carry sperm tied off (vasectomy).This blocks sperm from entering the vagina during sexual intercourse. After the procedure, the man can still ejaculate fluid (semen). NATURAL PLANNING METHODS  Natural family planning. This is not having sexual intercourse or using a barrier method (condom, diaphragm, cervical cap) on days the woman could become pregnant.  Calendar method. This is keeping track of the length of each menstrual cycle and identifying when you are fertile.  Ovulation method. This is avoiding sexual intercourse during ovulation.  Symptothermal  method. This is avoiding sexual intercourse during ovulation, using a thermometer and ovulation symptoms.  Post-ovulation method. This is timing sexual intercourse after you have ovulated. Regardless of which type or method of contraception you choose, it is important that you use condoms to protect against the transmission of sexually transmitted infections (STIs). Talk with your health care provider about which form of contraception is most appropriate for you. Document Released: 10/25/2005 Document Revised: 10/30/2013 Document Reviewed: 04/19/2013 [employer] Patient Information 2015 [employer]. This information is not intended to replace advice given to you by your health care provider. Make sure you discuss any questions you have with your health care provider.  

## 2014-06-03 LAB — PAP IG, CT-NG, RFX HPV ASCU
Chlamydia Probe Amp: NEGATIVE
GC PROBE AMP: NEGATIVE

## 2014-09-03 ENCOUNTER — Encounter: Payer: Self-pay | Admitting: Family Medicine

## 2014-09-03 NOTE — Addendum Note (Signed)
Addended by: Delman Cheadle on: 09/03/2014 12:08 AM   Modules accepted: Orders

## 2015-06-09 ENCOUNTER — Telehealth: Payer: Self-pay

## 2015-06-09 MED ORDER — ETONOGESTREL-ETHINYL ESTRADIOL 0.12-0.015 MG/24HR VA RING: VAGINAL_RING | VAGINAL | Status: DC

## 2015-06-09 NOTE — Telephone Encounter (Signed)
Spoke to pt and advised I can send in 1 RF to give her time to RTC since it has been over a year since seen. She agreed to come in.

## 2015-07-21 ENCOUNTER — Ambulatory Visit (INDEPENDENT_AMBULATORY_CARE_PROVIDER_SITE_OTHER): Payer: Managed Care, Other (non HMO) | Admitting: Urgent Care

## 2015-07-21 VITALS — BP 110/86 | HR 96 | Temp 98.1°F | Resp 16 | Wt 145.8 lb

## 2015-07-21 DIAGNOSIS — J302 Other seasonal allergic rhinitis: Secondary | ICD-10-CM | POA: Diagnosis not present

## 2015-07-21 DIAGNOSIS — R05 Cough: Secondary | ICD-10-CM | POA: Diagnosis not present

## 2015-07-21 DIAGNOSIS — J329 Chronic sinusitis, unspecified: Secondary | ICD-10-CM | POA: Diagnosis not present

## 2015-07-21 DIAGNOSIS — R87612 Low grade squamous intraepithelial lesion on cytologic smear of cervix (LGSIL): Secondary | ICD-10-CM | POA: Diagnosis not present

## 2015-07-21 DIAGNOSIS — R059 Cough, unspecified: Secondary | ICD-10-CM

## 2015-07-21 MED ORDER — HYDROCODONE-HOMATROPINE 5-1.5 MG/5ML PO SYRP: 5.0000 mL | ORAL_SOLUTION | Freq: Every evening | ORAL | Status: DC | PRN

## 2015-07-21 MED ORDER — FLUTICASONE PROPIONATE 50 MCG/ACT NA SUSP: 2.0000 | Freq: Every day | NASAL | Status: AC

## 2015-07-21 NOTE — Patient Instructions (Signed)
- Please use Sudafed (120mg  every 12 hours as need) for congestion - Take oral antihistamine daily through the end of September - Call me if you have no improvement in 5 days or sooner if you get worse such as start having fevers  Sinusitis Sinusitis is redness, soreness, and inflammation of the paranasal sinuses. Paranasal sinuses are air pockets within the bones of your face (beneath the eyes, the middle of the forehead, or above the eyes). In healthy paranasal sinuses, mucus is able to drain out, and air is able to circulate through them by way of your nose. However, when your paranasal sinuses are inflamed, mucus and air can become trapped. This can allow bacteria and other germs to grow and cause infection. Sinusitis can develop quickly and last only a short time (acute) or continue over a long period (chronic). Sinusitis that lasts for more than 12 weeks is considered chronic.  CAUSES  Causes of sinusitis include:  Allergies.  Structural abnormalities, such as displacement of the cartilage that separates your nostrils (deviated septum), which can decrease the air flow through your nose and sinuses and affect sinus drainage.  Functional abnormalities, such as when the small hairs (cilia) that line your sinuses and help remove mucus do not work properly or are not present. SIGNS AND SYMPTOMS  Symptoms of acute and chronic sinusitis are the same. The primary symptoms are pain and pressure around the affected sinuses. Other symptoms include:  Upper toothache.  Earache.  Headache.  Bad breath.  Decreased sense of smell and taste.  A cough, which worsens when you are lying flat.  Fatigue.  Fever.  Thick drainage from your nose, which often is green and may contain pus (purulent).  Swelling and warmth over the affected sinuses. DIAGNOSIS  Your health care provider will perform a physical exam. During the exam, your health care provider may:  Look in your nose for signs of  abnormal growths in your nostrils (nasal polyps).  Tap over the affected sinus to check for signs of infection.  View the inside of your sinuses (endoscopy) using an imaging device that has a light attached (endoscope). If your health care provider suspects that you have chronic sinusitis, one or more of the following tests may be recommended:  Allergy tests.  Nasal culture. A sample of mucus is taken from your nose, sent to a lab, and screened for bacteria.  Nasal cytology. A sample of mucus is taken from your nose and examined by your health care provider to determine if your sinusitis is related to an allergy. TREATMENT  Most cases of acute sinusitis are related to a viral infection and will resolve on their own within 10 days. Sometimes medicines are prescribed to help relieve symptoms (pain medicine, decongestants, nasal steroid sprays, or saline sprays).  However, for sinusitis related to a bacterial infection, your health care provider will prescribe antibiotic medicines. These are medicines that will help kill the bacteria causing the infection.  Rarely, sinusitis is caused by a fungal infection. In theses cases, your health care provider will prescribe antifungal medicine. For some cases of chronic sinusitis, surgery is needed. Generally, these are cases in which sinusitis recurs more than 3 times per year, despite other treatments. HOME CARE INSTRUCTIONS   Drink plenty of water. Water helps thin the mucus so your sinuses can drain more easily.  Use a humidifier.  Inhale steam 3 to 4 times a day (for example, sit in the bathroom with the shower running).  Apply  a warm, moist washcloth to your face 3 to 4 times a day, or as directed by your health care provider.  Use saline nasal sprays to help moisten and clean your sinuses.  Take medicines only as directed by your health care provider.  If you were prescribed either an antibiotic or antifungal medicine, finish it all even if  you start to feel better. SEEK IMMEDIATE MEDICAL CARE IF:  You have increasing pain or severe headaches.  You have nausea, vomiting, or drowsiness.  You have swelling around your face.  You have vision problems.  You have a stiff neck.  You have difficulty breathing. MAKE SURE YOU:   Understand these instructions.  Will watch your condition.  Will get help right away if you are not doing well or get worse. Document Released: 10/25/2005 Document Revised: 03/11/2014 Document Reviewed: 11/09/2011 [facility] Patient Information 2015 [place], [place]. This information is not intended to replace advice given to you by your health care provider. Make sure you discuss any questions you have with your health care provider.

## 2015-07-21 NOTE — Progress Notes (Signed)
    MRN: [id number] DOB: 05/20/1991  Subjective:   Anne Franco is a 24 y.o. female presenting for chief complaint of Dizziness; Sinus Problem; Cough; Nausea; Medication Refill; and Immunizations  Sinus problem - reports 4 day history of sinus pressure, congestion, productive cough without hemoptysis, ear fullness, itchy ears. Has tried Flonase (which she takes daily anyway) and Mucinex with little relief. Denies fever, eye pain, ear pain, ear drainage, tooth pain, chest pain, wheezing, n/v, abdominal pain. Admits history of seasonal allergies, takes Claritin and Flonase. Denies history of asthma. Denies smoking, drinks socially on the weekends.   Nuva ring refill - patient wanted to know if we could refill her NuvaRing prescription without doing a pelvic exam. I recommended patient followup with gynecologist given that she had LSIL from her Pap smear on July 2015. The recommendation was to have a colposcopy and biopsy performed the patient did not have this completed and cannot recall if she ever see results.  Flu shot - patient went on she should get flu shot today.  Denies any other aggravating or relieving factors, no other questions or concerns.  Halina has a current medication list which includes the following prescription(s): etonogestrel-ethinyl estradiol, fexofenadine hcl, and fluticasone. Also has No Known Allergies.  Dwanda  has no past medical history on file. Also  has no past surgical history on file.  Objective:   Vitals: BP 110/86 mmHg  Pulse 96  Temp(Src) 98.1 F (36.7 C) (Oral)  Resp 16  Wt 145 lb 12.8 oz (66.134 kg)  SpO2 98%  LMP 07/08/2015  Physical Exam  Constitutional: She is oriented to person, place, and time. She appears well-developed and well-nourished.  HENT:  TM's intact bilaterally, no effusions or erythema. Nasal turbinates slightly erythematous with thick mucus. Nasal passages patent. No sinus tenderness. Postnasal drip present, without  oropharyngeal exudates, erythema or abscesses.  Eyes: Right eye exhibits no discharge. Left eye exhibits no discharge. No scleral icterus.  Neck: Normal range of motion. Neck supple.  Cardiovascular: Normal rate, regular rhythm and intact distal pulses.  Exam reveals no gallop and no friction rub.   No murmur heard. Pulmonary/Chest: No respiratory distress. She has no wheezes. She has no rales.  Lymphadenopathy:    She has no cervical adenopathy.  Neurological: She is alert and oriented to person, place, and time.  Skin: Skin is warm and dry. No rash noted. No erythema. No pallor.    Assessment and Plan :   1. Sinusitis, unspecified chronicity, unspecified location 2. Seasonal allergies 3. Cough - Likely underiong viral syndrome worsened by her chronic allergies. Continue Flonase, restart Claritin, start Sudafed and Hycodan for cough, sore throat - If patient has no improvement in 5 days, she can call and I will send a prescription electronically to her pharmacy for amoxicillin. Patient and mother agreed.  4. Low grade squamous intraepithelial lesion (LGSIL) on Papanicolaou smear of cervix - I advised that OB/GYN could refill her Nuva ring but she could come back to [place] if necessary. Most importantly, patient needs to have f/u for her LGSIL with gynecologist. Referral pending. - Ambulatory referral to Gynecology   Jaynee Eagles, PA-C [facility] [phone] 07/21/2015 4:05 PM

## 2015-07-23 ENCOUNTER — Other Ambulatory Visit: Payer: Self-pay | Admitting: Obstetrics and Gynecology

## 2015-07-23 DIAGNOSIS — R87612 Low grade squamous intraepithelial lesion on cytologic smear of cervix (LGSIL): Secondary | ICD-10-CM

## 2015-07-28 ENCOUNTER — Telehealth: Payer: Self-pay | Admitting: Obstetrics and Gynecology

## 2015-07-28 NOTE — Telephone Encounter (Signed)
Patient returning Becky's call to schedule a new patient doctor referral and give benefits.

## 2015-07-31 ENCOUNTER — Telehealth: Payer: Self-pay

## 2015-07-31 MED ORDER — AMOXICILLIN 875 MG PO TABS: 875.0000 mg | ORAL_TABLET | Freq: Two times a day (BID) | ORAL | Status: DC

## 2015-07-31 NOTE — Telephone Encounter (Signed)
Please the patient know that I sent a prescription electronically to her pharmacy for amoxicillin. She is to take this twice a day for 10 days with food. Please also make sure the patient is being consistent with her allergy medication. If she has no improvement despite antibiotic course please have patient return to clinic.

## 2015-07-31 NOTE — Telephone Encounter (Signed)
Patient was advised to call after week if her cough doesn't go away. Patient is still coughing and would like to try another medication. Please advise!

## 2015-08-01 NOTE — Telephone Encounter (Signed)
Pt advised.

## 2015-08-04 ENCOUNTER — Ambulatory Visit (INDEPENDENT_AMBULATORY_CARE_PROVIDER_SITE_OTHER): Payer: Managed Care, Other (non HMO) | Admitting: Obstetrics and Gynecology

## 2015-08-04 ENCOUNTER — Encounter: Payer: Self-pay | Admitting: Obstetrics and Gynecology

## 2015-08-04 VITALS — BP 122/80 | HR 84 | Resp 14 | Ht 62.0 in | Wt 147.0 lb

## 2015-08-04 DIAGNOSIS — Z124 Encounter for screening for malignant neoplasm of cervix: Secondary | ICD-10-CM

## 2015-08-04 DIAGNOSIS — R87618 Other abnormal cytological findings on specimens from cervix uteri: Secondary | ICD-10-CM | POA: Diagnosis not present

## 2015-08-04 NOTE — Progress Notes (Signed)
Patient ID: Anne Franco, female   DOB: 06/10/1991, 24 y.o.   MRN: [id number] GYNECOLOGY  VISIT   HPI: 24 y.o.   Single  Caucasian  female   G0P0000 with Patient's last menstrual period was 07/13/2015.   Sent by Dr Delman Cheadle for evaluation of an abnromal PAP from 2015. In 7/15 the patient had a LSIL pap with a few cells suspicious for HSIL. Documentation in the chart that the patient was notified, referral was made. Patient states she didn't know about the abnormal pap. No prior abnormal paps.  Same partner x 6 years, using a nuvaring, not using condoms.  S/P gardasil x 3.  GYNECOLOGIC HISTORY: Patient's last menstrual period was 07/13/2015. Contraception:Nuvaring Menopausal hormone therapy: N/A        OB History    Gravida Para Term Preterm AB TAB SAB Ectopic Multiple Living   0 0 0 0 0 0 0 0 0 0          Patient Active Problem List   Diagnosis Date Noted  . Cough 07/21/2015  . Sinusitis 07/21/2015  . Seasonal allergies 07/21/2015  . Contraception 12/18/2011  . Recurrent otitis media 12/18/2011    History reviewed. No pertinent past medical history.  Past Surgical History  Procedure Laterality Date  . Tympanostomy tube placement      Current Outpatient Prescriptions  Medication Sig Dispense Refill  . amoxicillin (AMOXIL) 875 MG tablet Take 1 tablet (875 mg total) by mouth 2 (two) times daily. 20 tablet 0  . BIOTIN PO Take by mouth.    . Cyanocobalamin (VITAMIN B 12 PO) Take by mouth.    . etonogestrel-ethinyl estradiol (NUVARING) 0.12-0.015 MG/24HR vaginal ring Insert nuvaring vaginally and leave in for 3 weeks. Then out for 1 wk. PATIENT NEEDS OFFICE VISIT FOR ADDITIONAL REFILLS 1 each 0  . fluticasone (FLONASE) 50 MCG/ACT nasal spray Place 2 sprays into both nostrils at bedtime. 16 g 11   No current facility-administered medications for this visit.     ALLERGIES: Review of patient's allergies indicates no known allergies.  Family History  Problem Relation Age of  Onset  . Hyperlipidemia Mother   . Hypertension Mother   . Prostate cancer Father     Social History   Social History  . Marital Status: Single    Spouse Name: N/A  . Number of Children: N/A  . Years of Education: N/A   Occupational History  . Not on file.   Social History Main Topics  . Smoking status: Former Smoker -- .5 years  . Smokeless tobacco: Never Used  . Alcohol Use: 0.6 - 1.2 oz/week    1-2 Standard drinks or equivalent per week  . Drug Use: No  . Sexual Activity:    Partners: Male    Birth Control/ Protection: Inserts   Other Topics Concern  . Not on file   Social History Narrative    Review of Systems  All other systems reviewed and are negative.   PHYSICAL EXAMINATION:    BP 122/80 mmHg  Pulse 84  Resp 14  Ht 5\' 2"  (1.575 m)  Wt 147 lb (66.679 kg)  BMI 26.88 kg/m2  LMP 07/13/2015    General appearance: alert, cooperative and appears stated age  Pelvic: External genitalia:  no lesions              Urethra:  normal appearing urethra with no masses, tenderness or lesions  Bartholins and Skenes: normal                 Vagina: normal appearing vagina with normal color and discharge, no lesions              Cervix: + ectropion, friable with pap, no lesions               Chaperone was present for exam.  ASSESSMENT 24 yo female with a h/o a LSIL pap, few suspicious cells for HSIL at her pap last year    PLAN Reviewed abnormal pap's, HPV, colposcopy Pap done today, will set up colposcopy (would do irregardless because of the suspicion of HSIL last year)    An After Visit Summary was printed and given to the patient.  Cc: Delman Cheadle, MD

## 2015-08-04 NOTE — Patient Instructions (Signed)
hpvColposcopy Colposcopy is a procedure to examine your cervix and vagina, or the area around the outside of your vagina, for abnormalities or signs of disease. The procedure is done using a lighted microscope called a colposcope. Tissue samples may be collected during the colposcopy if your health care provider finds any unusual cells. A colposcopy may be done if a woman has:  An abnormal Pap test. A Pap test is a medical test done to evaluate cells that are on the surface of the cervix.  A Pap test result that is suggestive of human papillomavirus (HPV). This virus can cause genital warts and is linked to the development of cervical cancer.  A sore on her cervix and the results of a Pap test were normal.  Genital warts on the cervix or in or around the outside of the vagina.  A mother who took the drug diethylstilbestrol (DES) while pregnant.  Painful intercourse.  Vaginal bleeding, especially after sexual intercourse. LET [facility] CARE PROVIDER KNOW ABOUT:  Any allergies you have.  All medicines you are taking, including vitamins, herbs, eye drops, creams, and over-the-counter medicines.  Previous problems you or members of your family have had with the use of anesthetics.  Any blood disorders you have.  Previous surgeries you have had.  Medical conditions you have. RISKS AND COMPLICATIONS Generally, a colposcopy is a safe procedure. However, as with any procedure, complications can occur. Possible complications include:  Bleeding.  Infection.  Missed lesions. BEFORE THE PROCEDURE   Tell your health care provider if you have your menstrual period. A colposcopy typically is not done during menstruation.  For 24 hours before the colposcopy, do not:  [place].  Use tampons.  Use medicines, creams, or suppositories in the vagina.  Have sexual intercourse. PROCEDURE  During the procedure, you will be lying on your back with your feet in foot rests (stirrups). A warm  metal or plastic instrument (speculum) will be placed in your vagina to keep it open and to allow the health care provider to see the cervix. The colposcope will be placed outside the vagina. It will be used to magnify and examine the cervix, vagina, and the area around the outside of the vagina. A small amount of liquid solution will be placed on the area that is to be viewed. This solution will make it easier to see the abnormal cells. Your health care provider will use tools to suck out mucus and cells from the canal of the cervix. Then he or she will record the location of the abnormal areas. If a biopsy is done during the procedure, a medicine will usually be given to numb the area (local anesthetic). You may feel mild pain or cramping while the biopsy is done. After the procedure, tissue samples collected during the biopsy will be sent to a lab for analysis. AFTER THE PROCEDURE  You will be given instructions on when to follow up with your health care provider for your test results. It is important to keep your appointment. Document Released: 01/15/2003 Document Revised: 06/27/2013 Document Reviewed: 05/24/2013 [facility] Patient Information 2015 [place], [place]. This information is not intended to replace advice given to you by your health care provider. Make sure you discuss any questions you have with your health care provider. Abnormal Pap Test Information During a Pap test, the cells on the surface of your cervix are checked to see if they look normal, abnormal, or if they show signs of having been altered by a certain type  of virus called human papillomavirus, or HPV. Cervical cells that have been affected by HPV are called dysplasia. Dysplasia is not cancer, but describes abnormal cells found on the surface of the cervix. Depending on the degree of dysplasia, some of the cells may be considered pre-cancerous and may turn into cancer over time if follow up with a caregiver is delayed.  WHAT DOES AN  ABNORMAL PAP TEST MEAN? Having an abnormal pap test does not mean that you have cancer. However, certain types of abnormal pap tests can be a sign that a person is at a higher risk of developing cancer. Your caregiver will want to do other tests to find out more about the abnormal cells. Your abnormal Pap test results could show:   Small and uncertain changes that should be carefully watched.   Cervical dysplasia that has caused mild changes and can be followed over time.  Cervical dysplasia that is more severe and needs to be followed and treated to ensure the problem goes away.  Cancer.  When severe cervical dysplasia is found and treated early, it rarely will grow into cancer.  WHAT WILL BE DONE ABOUT MY ABNORMAL PAP TEST?  A colposcopy may be needed. This is a procedure where your cervix is examined using light and magnification.  A small tissue sample of your cervix (biopsy) may need to be removed and then examined. This is often performed if there are areas that appear infected.  A sample of cells from the cervical canal may be removed with either a small brush or scraping instrument (curette). Based on the results of the procedures above, some caregivers may recommend either cryotherapy of the cervix or a surgical LEEP where a portion of the cervix is removed. LEEP is short for "loop electrical excisional procedure." Rarely, a caregiver may recommend a cone biopsy.This is a procedure where a small, cone-shaped sample of your cervix is taken out. The part that is taken out is the area where the abnormal cells are.  WHAT IF I HAVE A DYSPLASIA OR A CANCER? You may be referred to a specialist. Radiation may also be a treatment for more advanced cancer. Having a hysterectomy is the last treatment option for dysplasia, but it is a more common treatment for someone with cancer. All treatment options will be discussed with you by your caregiver. WHAT SHOULD YOU DO AFTER BEING TREATED? If  you have had an abnormal pap test, you should continue to have regular pap tests and check-ups as directed by your caregiver. Your cervical problem will be carefully watched so it does not get worse. Also, your caregiver can watch for, and treat, any new problems that may come up. Document Released: 02/09/2011 Document Revised: 02/19/2013 Document Reviewed: 10/21/2011 [facility] Patient Information 2015 [place], [place]. This information is not intended to replace advice given to you by your health care provider. Make sure you discuss any questions you have with your health care provider.

## 2015-08-06 LAB — IPS PAP TEST WITH HPV

## 2015-08-12 ENCOUNTER — Telehealth: Payer: Self-pay | Admitting: Emergency Medicine

## 2015-08-12 NOTE — Telephone Encounter (Signed)
-----   Message from Nicholes Rough, [place] sent at 08/07/2015  8:00 AM EDT ----- Sending to triage -eh

## 2015-08-12 NOTE — Telephone Encounter (Signed)
Message left to return call to [place] at [phone].   Patient using Nuva Ring.

## 2015-08-12 NOTE — Telephone Encounter (Signed)
Notes Recorded by Nicholes Rough, CMA on 08/07/2015 at 8:00 AM Sending to triage -eh Notes Recorded by Salvadore Dom, MD on 08/06/2015 at 4:57 PM This patient had a LSIL, suspicious for HSIL last year, she should still return for a colposcopy.

## 2015-08-13 NOTE — Telephone Encounter (Signed)
Patient returned call. She is given message from Dr. Talbert Nan.  She has been already scheduled for colposcopy. Uses nuva ring, has not taken Nuva Ring out this week.  Patient scheduled for colposcopy tomorrow 08/14/15. Pre-procedure instructions given. Motrin 800 mg PO one hour before appointment with food and water. Encouraged good hydration and meal prior to appointment. Patient verbalized understanding.  Routing to provider for final review. Patient agreeable to disposition. Will close encounter.

## 2015-08-14 ENCOUNTER — Encounter: Payer: Self-pay | Admitting: Obstetrics and Gynecology

## 2015-08-14 ENCOUNTER — Ambulatory Visit (INDEPENDENT_AMBULATORY_CARE_PROVIDER_SITE_OTHER): Payer: Managed Care, Other (non HMO) | Admitting: Obstetrics and Gynecology

## 2015-08-14 VITALS — BP 132/84 | HR 80 | Resp 14 | Wt 146.0 lb

## 2015-08-14 DIAGNOSIS — R87612 Low grade squamous intraepithelial lesion on cytologic smear of cervix (LGSIL): Secondary | ICD-10-CM

## 2015-08-14 DIAGNOSIS — Z01812 Encounter for preprocedural laboratory examination: Secondary | ICD-10-CM | POA: Diagnosis not present

## 2015-08-14 LAB — POCT URINE PREGNANCY: PREG TEST UR: NEGATIVE

## 2015-08-14 MED ORDER — ETONOGESTREL-ETHINYL ESTRADIOL 0.12-0.015 MG/24HR VA RING: VAGINAL_RING | VAGINAL | Status: DC

## 2015-08-14 NOTE — Progress Notes (Signed)
Patient ID: Anne Franco, female   DOB: 1991-06-10, 24 y.o.   MRN: [id number] GYNECOLOGY  VISIT   HPI: 24 y.o.   Single  Caucasian  female   G0P0000 with Patient's last menstrual period was 07/13/2015.   here for  Colposcopy. Last year the patient had a LSIL pap with a few cells suspicious for HSIL, she didn't have a colpo at that time. Recent pap was negative with negative hpv. Given the prior concern for HSIL, colposcopy is recommended.   GYNECOLOGIC HISTORY: Patient's last menstrual period was 07/13/2015. Contraception:Nuvaring Menopausal hormone therapy: N/A        OB History    Gravida Para Term Preterm AB TAB SAB Ectopic Multiple Living   0 0 0 0 0 0 0 0 0 0          Patient Active Problem List   Diagnosis Date Noted  . Cough 07/21/2015  . Sinusitis 07/21/2015  . Seasonal allergies 07/21/2015  . Contraception 12/18/2011  . Recurrent otitis media 12/18/2011    History reviewed. No pertinent past medical history.  Past Surgical History  Procedure Laterality Date  . Tympanostomy tube placement      Current Outpatient Prescriptions  Medication Sig Dispense Refill  . BIOTIN PO Take by mouth.    . Cyanocobalamin (VITAMIN B 12 PO) Take by mouth.    . etonogestrel-ethinyl estradiol (NUVARING) 0.12-0.015 MG/24HR vaginal ring Insert nuvaring vaginally and leave in for 3 weeks. Then out for 1 wk. PATIENT NEEDS OFFICE VISIT FOR ADDITIONAL REFILLS 1 each 0  . fluticasone (FLONASE) 50 MCG/ACT nasal spray Place 2 sprays into both nostrils at bedtime. 16 g 11   No current facility-administered medications for this visit.     ALLERGIES: Review of patient's allergies indicates no known allergies.  Family History  Problem Relation Age of Onset  . Hyperlipidemia Mother   . Hypertension Mother   . Prostate cancer Father     Social History   Social History  . Marital Status: Single    Spouse Name: N/A  . Number of Children: N/A  . Years of Education: N/A   Occupational  History  . Not on file.   Social History Main Topics  . Smoking status: Former Smoker -- .5 years  . Smokeless tobacco: Never Used  . Alcohol Use: 0.6 - 1.2 oz/week    1-2 Standard drinks or equivalent per week  . Drug Use: No  . Sexual Activity:    Partners: Male    Birth Control/ Protection: Inserts   Other Topics Concern  . Not on file   Social History Narrative    Review of Systems  Gastrointestinal:       Bloating  All other systems reviewed and are negative.   PHYSICAL EXAMINATION:    BP 132/84 mmHg  Pulse 80  Resp 14  Wt 146 lb (66.225 kg)  LMP 07/13/2015    General appearance: alert, cooperative and appears stated age  Pelvic: External genitalia:  no lesions              Urethra:  normal appearing urethra with no masses, tenderness or lesions              Bartholins and Skenes: normal                 Vagina: normal appearing vagina with normal color and discharge, no lesions              Cervix: ectropion  Colposcopy: satisfactory,  aceto-white changes with some mosaics and decreased lugols noted at the periphery at 11-1 o'clock. Biopsies taken at 12 and 1 o'clock. Negative lugols examination of the vagina. ECC taken  Chaperone was present for exam.  ASSESSMENT H/O LSIL pap suspicious for HSIL last year Negative pap with hpv recently Colposcopy still warranted given the suspicion for HSIL    PLAN Colposcopy with biopsies, ECC Plan after results return  Patient requested refill of nuvaring, sent in.   An After Visit Summary was printed and given to the patient.   Cc: Delman Cheadle, MD

## 2015-08-14 NOTE — Addendum Note (Signed)
Addended by: Dorothy Spark on: 08/14/2015 10:41 AM   Modules accepted: Orders

## 2015-08-14 NOTE — Patient Instructions (Signed)

## 2015-08-19 ENCOUNTER — Encounter: Payer: Self-pay | Admitting: Family Medicine

## 2015-08-19 DIAGNOSIS — N870 Mild cervical dysplasia: Secondary | ICD-10-CM | POA: Insufficient documentation

## 2015-08-30 NOTE — Progress Notes (Signed)
  Medical screening examination/treatment/procedure(s) were performed by non-physician practitioner and as supervising physician I was immediately available for consultation/collaboration.     

## 2016-02-04 ENCOUNTER — Encounter: Payer: Self-pay | Admitting: Obstetrics and Gynecology

## 2016-03-11 ENCOUNTER — Encounter: Payer: Self-pay | Admitting: Obstetrics and Gynecology

## 2016-07-30 ENCOUNTER — Other Ambulatory Visit: Payer: Self-pay | Admitting: Family Medicine

## 2016-07-30 ENCOUNTER — Other Ambulatory Visit: Payer: Self-pay | Admitting: Obstetrics and Gynecology

## 2016-07-30 NOTE — Telephone Encounter (Signed)
eScribe request from [facility] for refill on NUVARING Last filled - 08/14/15 #3 with 3 refills Last AEX - 08/04/15 Next AEX - not scheduled Pt is in 08 Recall.  Message left for patient to return call regarding refill. [phone]) Mobile

## 2016-07-31 NOTE — Telephone Encounter (Signed)
What is her follow up plan for nuvaring (overdue)

## 2016-07-31 NOTE — Telephone Encounter (Signed)
Pt called back to let us know that the novaring was taken care of thru her obgyn not [place]   Best number [phone]

## 2016-08-02 NOTE — Telephone Encounter (Signed)
Pt returning you call

## 2016-08-02 NOTE — Telephone Encounter (Signed)
Return call to patient.  Patient states she is losing her insurance at the end of the week/month and really needs AEX and Refill at this time.  Patient is scheduled for 08/05/16 @ 10:30 with Dr. Talbert Nan for annual exam.    Refill for one month on NuvaRing sent to [facility].  Routing to provider for final review.  Closing encounter.

## 2016-08-05 ENCOUNTER — Ambulatory Visit: Payer: Self-pay | Admitting: Obstetrics and Gynecology

## 2016-08-05 ENCOUNTER — Ambulatory Visit (INDEPENDENT_AMBULATORY_CARE_PROVIDER_SITE_OTHER): Payer: Managed Care, Other (non HMO) | Admitting: Obstetrics and Gynecology

## 2016-08-05 ENCOUNTER — Ambulatory Visit
Admission: RE | Admit: 2016-08-05 | Discharge: 2016-08-05 | Disposition: A | Discharge status: Home / Self Care | Payer: Managed Care, Other (non HMO) | Source: Ambulatory Visit | Attending: Obstetrics and Gynecology | Admitting: Obstetrics and Gynecology

## 2016-08-05 ENCOUNTER — Encounter: Payer: Self-pay | Admitting: Obstetrics and Gynecology

## 2016-08-05 VITALS — BP 108/78 | HR 88 | Resp 16 | Ht 62.0 in | Wt 124.0 lb

## 2016-08-05 DIAGNOSIS — Z124 Encounter for screening for malignant neoplasm of cervix: Secondary | ICD-10-CM

## 2016-08-05 DIAGNOSIS — Z01419 Encounter for gynecological examination (general) (routine) without abnormal findings: Secondary | ICD-10-CM

## 2016-08-05 DIAGNOSIS — N63 Unspecified lump in breast: Secondary | ICD-10-CM | POA: Diagnosis not present

## 2016-08-05 DIAGNOSIS — N6325 Unspecified lump in the left breast, overlapping quadrants: Secondary | ICD-10-CM

## 2016-08-05 DIAGNOSIS — N6320 Unspecified lump in the left breast, unspecified quadrant: Secondary | ICD-10-CM

## 2016-08-05 DIAGNOSIS — Z0000 Encounter for general adult medical examination without abnormal findings: Secondary | ICD-10-CM

## 2016-08-05 DIAGNOSIS — N870 Mild cervical dysplasia: Secondary | ICD-10-CM

## 2016-08-05 LAB — LIPID PANEL
CHOLESTEROL: 197 mg/dL (ref 125–200)
HDL: 70 mg/dL (ref >=46)
LDL Cholesterol: 108 mg/dL (ref <130)
Total CHOL/HDL Ratio: 2.8 Ratio (ref <=5.0)
Triglycerides: 96 mg/dL (ref <150)
VLDL: 19 mg/dL (ref <30)

## 2016-08-05 LAB — COMPREHENSIVE METABOLIC PANEL
ALBUMIN: 4.2 g/dL (ref 3.6–5.1)
ALK PHOS: 46 U/L (ref 33–115)
ALT: 14 U/L (ref 6–29)
AST: 15 U/L (ref 10–30)
BILIRUBIN TOTAL: 1 mg/dL (ref 0.2–1.2)
BUN: 7 mg/dL (ref 7–25)
CO2: 23 mmol/L (ref 20–31)
CREATININE: 0.6 mg/dL (ref 0.50–1.10)
Calcium: 9.5 mg/dL (ref 8.6–10.2)
Chloride: 102 mmol/L (ref 98–110)
Glucose, Bld: 72 mg/dL (ref 65–99)
Potassium: 4 mmol/L (ref 3.5–5.3)
SODIUM: 139 mmol/L (ref 135–146)
TOTAL PROTEIN: 7 g/dL (ref 6.1–8.1)

## 2016-08-05 LAB — CBC
HCT: 43.2 % (ref 35.0–45.0)
Hemoglobin: 14.5 g/dL (ref 11.7–15.5)
MCH: 30 pg (ref 27.0–33.0)
MCHC: 33.6 g/dL (ref 32.0–36.0)
MCV: 89.3 fL (ref 80.0–100.0)
MPV: 8.7 fL (ref 7.5–12.5)
PLATELETS: 341 10*3/uL (ref 140–400)
RBC: 4.84 MIL/uL (ref 3.80–5.10)
RDW: 12.6 % (ref 11.0–15.0)
WBC: 10.1 10*3/uL (ref 3.8–10.8)

## 2016-08-05 MED ORDER — ETONOGESTREL-ETHINYL ESTRADIOL 0.12-0.015 MG/24HR VA RING: VAGINAL_RING | VAGINAL | 3 refills | Status: DC

## 2016-08-05 NOTE — Patient Instructions (Signed)
EXERCISE AND DIET:  We recommended that you start or continue a regular exercise program for good health. Regular exercise means any activity that makes your heart beat faster and makes you sweat.  We recommend exercising at least 30 minutes per day at least 3 days a week, preferably 4 or 5.  We also recommend a diet low in fat and sugar.  Inactivity, poor dietary choices and obesity can cause diabetes, heart attack, stroke, and kidney damage, among others.    ALCOHOL AND SMOKING:  Women should limit their alcohol intake to no more than 7 drinks/beers/glasses of wine (combined, not each!) per week. Moderation of alcohol intake to this level decreases your risk of breast cancer and liver damage. And of course, no recreational drugs are part of a healthy lifestyle.  And absolutely no smoking or even second hand smoke. Most people know smoking can cause heart and lung diseases, but did you know it also contributes to weakening of your bones? Aging of your skin?  Yellowing of your teeth and nails?  CALCIUM AND VITAMIN D:  Adequate intake of calcium and Vitamin D are recommended.  The recommendations for exact amounts of these supplements seem to change often, but generally speaking 600 mg of calcium (either carbonate or citrate) and 800 units of Vitamin D per day seems prudent. Certain women may benefit from higher intake of Vitamin D.  If you are among these women, your doctor will have told you during your visit.    PAP SMEARS:  Pap smears, to check for cervical cancer or precancers,  have traditionally been done yearly, although recent scientific advances have shown that most women can have pap smears less often.  However, every woman still should have a physical exam from her gynecologist every year. It will include a breast check, inspection of the vulva and vagina to check for abnormal growths or skin changes, a visual exam of the cervix, and then an exam to evaluate the size and shape of the uterus and  ovaries.  And after 26 years of age, a rectal exam is indicated to check for rectal cancers. We will also provide age appropriate advice regarding health maintenance, like when you should have certain vaccines, screening for sexually transmitted diseases, bone density testing, colonoscopy, mammograms, etc.   MAMMOGRAMS:  All women over 40 years old should have a yearly mammogram. Many facilities now offer a "3D" mammogram, which may cost around $50 extra out of pocket. If possible,  we recommend you accept the option to have the 3D mammogram performed.  It both reduces the number of women who will be called back for extra views which then turn out to be normal, and it is better than the routine mammogram at detecting truly abnormal areas.    COLONOSCOPY:  Colonoscopy to screen for colon cancer is recommended for all women at age 50.  We know, you hate the idea of the prep.  We agree, BUT, having colon cancer and not knowing it is worse!!  Colon cancer so often starts as a polyp that can be seen and removed at colonscopy, which can quite literally save your life!  And if your first colonoscopy is normal and you have no family history of colon cancer, most women don't have to have it again for 10 years.  Once every ten years, you can do something that may end up saving your life, right?  We will be happy to help you get it scheduled when you are ready.    Be sure to check your insurance coverage so you understand how much it will cost.  It may be covered as a preventative service at no cost, but you should check your particular policy.      Breast Self-Awareness Practicing breast self-awareness may pick up problems early, prevent significant medical complications, and possibly save your life. By practicing breast self-awareness, you can become familiar with how your breasts look and feel and if your breasts are changing. This allows you to notice changes early. It can also offer you some reassurance that your  breast health is good. One way to learn what is normal for your breasts and whether your breasts are changing is to do a breast self-exam. If you find a lump or something that was not present in the past, it is best to contact your caregiver right away. Other findings that should be evaluated by your caregiver include nipple discharge, especially if it is bloody; skin changes or reddening; areas where the skin seems to be pulled in (retracted); or new lumps and bumps. Breast pain is seldom associated with cancer (malignancy), but should also be evaluated by a caregiver. HOW TO PERFORM A BREAST SELF-EXAM The best time to examine your breasts is 5-7 days after your menstrual period is over. During menstruation, the breasts are lumpier, and it may be more difficult to pick up changes. If you do not menstruate, have reached menopause, or had your uterus removed (hysterectomy), you should examine your breasts at regular intervals, such as monthly. If you are breastfeeding, examine your breasts after a feeding or after using a breast pump. Breast implants do not decrease the risk for lumps or tumors, so continue to perform breast self-exams as recommended. Talk to your caregiver about how to determine the difference between the implant and breast tissue. Also, talk about the amount of pressure you should use during the exam. Over time, you will become more familiar with the variations of your breasts and more comfortable with the exam. A breast self-exam requires you to remove all your clothes above the waist. 1. Look at your breasts and nipples. Stand in front of a mirror in a room with good lighting. With your hands on your hips, push your hands firmly downward. Look for a difference in shape, contour, and size from one breast to the other (asymmetry). Asymmetry includes puckers, dips, or bumps. Also, look for skin changes, such as reddened or scaly areas on the breasts. Look for nipple changes, such as discharge,  dimpling, repositioning, or redness. 2. Carefully feel your breasts. This is best done either in the shower or tub while using soapy water or when flat on your back. Place the arm (on the side of the breast you are examining) above your head. Use the pads (not the fingertips) of your three middle fingers on your opposite hand to feel your breasts. Start in the underarm area and use  inch (2 cm) overlapping circles to feel your breast. Use 3 different levels of pressure (light, medium, and firm pressure) at each circle before moving to the next circle. The light pressure is needed to feel the tissue closest to the skin. The medium pressure will help to feel breast tissue a little deeper, while the firm pressure is needed to feel the tissue close to the ribs. Continue the overlapping circles, moving downward over the breast until you feel your ribs below your breast. Then, move one finger-width towards the center of the body. Continue to use the    inch (2 cm) overlapping circles to feel your breast as you move slowly up toward the collar bone (clavicle) near the base of the neck. Continue the up and down exam using all 3 pressures until you reach the middle of the chest. Do this with each breast, carefully feeling for lumps or changes. 3.  Keep a written record with breast changes or normal findings for each breast. By writing this information down, you do not need to depend only on memory for size, tenderness, or location. Write down where you are in your menstrual cycle, if you are still menstruating. Breast tissue can have some lumps or thick tissue. However, see your caregiver if you find anything that concerns you.  SEEK MEDICAL CARE IF:  You see a change in shape, contour, or size of your breasts or nipples.   You see skin changes, such as reddened or scaly areas on the breasts or nipples.   You have an unusual discharge from your nipples.   You feel a new lump or unusually thick areas.     This information is not intended to replace advice given to you by your health care provider. Make sure you discuss any questions you have with your health care provider.   Document Released: 10/25/2005 Document Revised: 10/11/2012 Document Reviewed: 02/09/2012 Elsevier Interactive Patient Education 2016 [employer] Inc.  

## 2016-08-05 NOTE — Progress Notes (Signed)
Scheduled patient while in office for left breast ultrasound at the [facility] for 08/05/2016 at 2:40 pm. Patient is agreeable to date and time. Placed in mammogram hold.

## 2016-08-05 NOTE — Progress Notes (Signed)
25 y.o. G0P0000 SingleCaucasianF here for annual exam.  She is doing well with the nuvaring. Menses q month x 2-3 days, light. Saturates a regular tampon in 8 hours. No cramps. Still with the same long term partner (6 years), live together.     Patient's last menstrual period was 07/11/2016.          Sexually active: Yes.    The current method of family planning is NuvaRing vaginal inserts.    Exercising: Yes.    Walking, yoga Smoker:  no  Health Maintenance: Pap:  08/04/15 Neg. 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 She quit after 0.50 years of use. She has never used smokeless tobacco. She reports that she drinks about 0.6 - 1.2 oz of alcohol per week . She reports that she does not use drugs. She is a Emergency planning/management officer.   History reviewed. No pertinent past medical history.  Past Surgical History:  Procedure Laterality Date  . TYMPANOSTOMY TUBE PLACEMENT      Current Outpatient Prescriptions  Medication Sig Dispense Refill  . BIOTIN PO Take by mouth.    . Cyanocobalamin (VITAMIN B 12 PO) Take by mouth.    . fluticasone (FLONASE) 50 MCG/ACT nasal spray Place 2 sprays into both nostrils at bedtime. 16 g 11  . NUVARING 0.12-0.015 MG/24HR vaginal ring INSERT 1 RING VAGINALLY AND LEAVE IN FOR 3 WEEKS, THEN OUT FOR 1 WEEK 1 each 0   No current facility-administered medications for this visit.     Family History  Problem Relation Age of Onset  . Hyperlipidemia Mother   . Hypertension Mother   . Prostate cancer Father     Review of Systems  Constitutional: Negative.   HENT: Negative.   Eyes: Negative.   Respiratory: Negative.   Cardiovascular: Negative.   Gastrointestinal: Negative.   Endocrine: Negative.   Genitourinary: Negative.   Musculoskeletal: Negative.   Skin: Negative.   Allergic/Immunologic: Negative.   Neurological: Negative.    Hematological: Negative.   Psychiatric/Behavioral: Negative.     Exam:   BP 108/78 (BP Location: Right Arm, Patient Position: Sitting, Cuff Size: Normal)   Pulse 88   Resp 16   Ht 5\' 2"  (1.575 m)   Wt 124 lb (56.2 kg)   LMP 07/11/2016   BMI 22.68 kg/m   Weight change: @WEIGHTCHANGE @ Height:   Height: 5\' 2"  (157.5 cm)  Ht Readings from Last 3 Encounters:  08/05/16 5\' 2"  (1.575 m)  08/04/15 5\' 2"  (1.575 m)  05/30/14 5\' 3"  (1.6 m)    General appearance: alert, cooperative and appears stated age Head: Normocephalic, without obvious abnormality, atraumatic Neck: no adenopathy, supple, symmetrical, trachea midline and thyroid normal to inspection and palpation Lungs: clear to auscultation bilaterally Breasts: in the left breast at 12 o'clock, just outside the areolar region is a <1 cm, smooth mobile lump. No other lumps, no skin changes. Heart: regular rate and rhythm Abdomen: soft, non-tender; bowel sounds normal; no masses,  no organomegaly Extremities: extremities normal, atraumatic, no cyanosis or edema Skin: Skin color, texture, turgor normal. No rashes or lesions Lymph nodes: Cervical, supraclavicular, and axillary nodes normal. No abnormal inguinal nodes palpated Neurologic: Grossly normal   Pelvic: External genitalia:  no lesions              Urethra:  normal appearing  urethra with no masses, tenderness or lesions              Bartholins and Skenes: normal                 Vagina: normal appearing vagina with normal color and discharge, no lesions              Cervix: no lesions               Bimanual Exam:  Uterus:  normal size, contour, position, consistency, mobility, non-tender              Adnexa: no mass, fullness, tenderness               Rectovaginal: Confirms               Anus:  normal sphincter tone, no lesions  Chaperone was present for exam.  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

## 2016-08-10 LAB — IPS PAP TEST WITH HPV

## 2016-08-11 ENCOUNTER — Telehealth: Payer: Self-pay | Admitting: *Deleted

## 2016-08-11 NOTE — Telephone Encounter (Signed)
Patient aware of results- see result note -eh

## 2016-08-11 NOTE — Telephone Encounter (Signed)
-----   Message from Salvadore Dom, MD sent at 08/11/2016 11:20 AM EDT ----- Please call the patient and let her know her pap and hpv are both negative!! She had CIN I last year. She needs another pap in a year and if normal can space out her pap's.

## 2016-08-11 NOTE — Telephone Encounter (Signed)
Left message for patient to call regarding PAP results. Patient placed in 08 recall -eh

## 2017-01-17 ENCOUNTER — Ambulatory Visit (INDEPENDENT_AMBULATORY_CARE_PROVIDER_SITE_OTHER): Payer: BLUE CROSS/BLUE SHIELD | Admitting: Emergency Medicine

## 2017-01-17 VITALS — BP 118/88 | HR 97 | Temp 97.6°F | Resp 18 | Ht 62.0 in | Wt 128.6 lb

## 2017-01-17 DIAGNOSIS — J069 Acute upper respiratory infection, unspecified: Secondary | ICD-10-CM | POA: Diagnosis not present

## 2017-01-17 DIAGNOSIS — R41840 Attention and concentration deficit: Secondary | ICD-10-CM | POA: Diagnosis not present

## 2017-01-17 DIAGNOSIS — R0981 Nasal congestion: Secondary | ICD-10-CM | POA: Insufficient documentation

## 2017-01-17 DIAGNOSIS — J029 Acute pharyngitis, unspecified: Secondary | ICD-10-CM | POA: Diagnosis not present

## 2017-01-17 MED ORDER — AZITHROMYCIN 250 MG PO TABS: ORAL_TABLET | ORAL | 0 refills | Status: DC

## 2017-01-17 NOTE — Patient Instructions (Addendum)
     IF you received an x-ray today, you will receive an invoice from [facility]. Please contact [facility] at [phone] with questions or concerns regarding your invoice.   IF you received labwork today, you will receive an invoice from [facility]. Please contact [facility] at [phone] with questions or concerns regarding your invoice.   Our billing staff will not be able to assist you with questions regarding bills from these companies.  You will be contacted with the lab results as soon as they are available. The fastest way to get your results is to activate your My Chart account. Instructions are located on the last page of this paperwork. If you have not heard from [place] regarding the results in 2 weeks, please contact this office.      Sore Throat When you have a sore throat, your throat may:  Hurt.  Burn.  Feel irritated.  Feel scratchy. Many things can cause a sore throat, including:  An infection.  Allergies.  Dryness in the air.  Smoke or pollution.  Gastroesophageal reflux disease (GERD).  A tumor. A sore throat can be the first sign of another sickness. It can happen with other problems, like coughing or a fever. Most sore throats go away without treatment. Follow these instructions at home:  Take over-the-counter medicines only as told by your doctor.  Drink enough fluids to keep your pee (urine) clear or pale yellow.  Rest when you feel you need to.  To help with pain, try:  Sipping warm liquids, such as broth, herbal tea, or warm water.  Eating or drinking cold or frozen liquids, such as frozen ice pops.  Gargling with a salt-water mixture 3-4 times a day or as needed. To make a salt-water mixture, add -1 tsp of salt in 1 cup of warm water. Mix it until you cannot see the salt anymore.  Sucking on hard candy or throat lozenges.  Putting a cool-mist humidifier in your bedroom at night.  Sitting in the bathroom with the door  closed for 5-10 minutes while you run hot water in the shower.  Do not use any tobacco products, such as cigarettes, chewing tobacco, and e-cigarettes. If you need help quitting, ask your doctor. Contact a doctor if:  You have a fever for more than 2-3 days.  You keep having symptoms for more than 2-3 days.  Your throat does not get better in 7 days.  You have a fever and your symptoms suddenly get worse. Get help right away if:  You have trouble breathing.  You cannot swallow fluids, soft foods, or your saliva.  You have swelling in your throat or neck that gets worse.  You keep feeling like you are going to throw up (vomit).  You keep throwing up. This information is not intended to replace advice given to you by your health care provider. Make sure you discuss any questions you have with your health care provider. Document Released: 08/03/2008 Document Revised: 06/20/2016 Document Reviewed: 08/15/2015 Elsevier Interactive Patient Education  2017 [employer].

## 2017-01-17 NOTE — Progress Notes (Signed)
Anne Franco 26 y.o.   Chief Complaint  Patient presents with  . Sore Throat    x 3 days   . Nasal Congestion  . Cough    HISTORY OF PRESENT ILLNESS: This is a 26 y.o. female complaining of 2-3 days h/o sore throat along with cough, nasal congestion  Sore Throat   This is a new problem. The current episode started in the past 7 days. The problem has been gradually worsening. There has been no fever. The pain is at a severity of 5/10. The pain is moderate. Associated symptoms include congestion and a plugged ear sensation. Pertinent negatives include no abdominal pain, diarrhea, ear discharge, neck pain, swollen glands, trouble swallowing or vomiting. She has had no exposure to strep or mono. She has tried cool liquids for the symptoms.     Prior to Admission medications   Medication Sig Start Date End Date Taking? Authorizing Provider  BIOTIN PO Take by mouth.   Yes Historical Provider, MD  etonogestrel-ethinyl estradiol (NUVARING) 0.12-0.015 MG/24HR vaginal ring INSERT 1 RING VAGINALLY AND LEAVE IN FOR 3 WEEKS, THEN OUT FOR 1 WEEK 08/05/16  Yes Salvadore Dom, MD  fluticasone [facility]) 50 MCG/ACT nasal spray Place 2 sprays into both nostrils at bedtime. 07/21/15  Yes Jaynee Eagles, PA-C  Cyanocobalamin (VITAMIN B 12 PO) Take by mouth.    Historical Provider, MD    No Known Allergies  Patient Active Problem List   Diagnosis Date Noted  . Dysplasia of cervix, low grade (CIN 1) 08/19/2015  . Cough 07/21/2015  . Sinusitis 07/21/2015  . Seasonal allergies 07/21/2015  . Contraception 12/18/2011  . Recurrent otitis media 12/18/2011    No past medical history on file.  Past Surgical History:  Procedure Laterality Date  . TYMPANOSTOMY TUBE PLACEMENT      Social History   Social History  . Marital status: Single    Spouse name: N/A  . Number of children: N/A  . Years of education: N/A   Occupational History  . Not on file.   Social History Main Topics  . Smoking  status: Former Smoker    Years: 0.50  . Smokeless tobacco: Never Used  . Alcohol use 0.6 - 1.2 oz/week    1 - 2 Standard drinks or equivalent per week  . Drug use: No  . Sexual activity: Yes    Partners: Male    Birth control/ protection: Inserts   Other Topics Concern  . Not on file   Social History Narrative  . No narrative on file    Family History  Problem Relation Age of Onset  . Hyperlipidemia Mother   . Hypertension Mother   . Prostate cancer Father      Review of Systems  Constitutional: Positive for malaise/fatigue.  HENT: Positive for congestion. Negative for ear discharge and trouble swallowing.        Ear fullness.  Eyes: Negative for discharge and redness.  Respiratory: Negative for sputum production.   Cardiovascular: Negative for palpitations.  Gastrointestinal: Negative for abdominal pain, diarrhea, nausea and vomiting.  Genitourinary: Negative for dysuria and hematuria.  Musculoskeletal: Negative for neck pain.  Neurological: Negative for dizziness.  Psychiatric/Behavioral: Negative for depression and suicidal ideas.       Lack of concentration/focus, possible ADD.  All other systems reviewed and are negative.  Vitals:   01/17/17 0953  BP: 118/88  Pulse: 97  Resp: 18  Temp: 97.6 F (36.4 C)     Physical Exam  Constitutional: She is oriented to person, place, and time. She appears well-developed and well-nourished.  HENT:  Head: Normocephalic and atraumatic.  Right Ear: External ear normal.  Left Ear: External ear normal.  Nose: Nose normal.  Mouth/Throat: Uvula is midline and mucous membranes are normal. Posterior oropharyngeal erythema present. No oropharyngeal exudate or tonsillar abscesses.  Eyes: Conjunctivae and EOM are normal. Pupils are equal, round, and reactive to light.  Neck: Normal range of motion. Neck supple. No JVD present. No thyromegaly present.  Cardiovascular: Normal rate, regular rhythm and normal heart sounds.     Pulmonary/Chest: Effort normal and breath sounds normal.  Abdominal: Soft. Bowel sounds are normal. There is no tenderness.  Musculoskeletal: Normal range of motion.  Lymphadenopathy:    She has no cervical adenopathy.  Neurological: She is alert and oriented to person, place, and time. No sensory deficit. She exhibits normal muscle tone.  Skin: Skin is warm and dry. Capillary refill takes less than 2 seconds.  Psychiatric: She has a normal mood and affect. Her behavior is normal.  Vitals reviewed.    ASSESSMENT & PLAN: Anne Franco was seen today for sore throat, nasal congestion and cough.  Diagnoses and all orders for this visit:  Acute pharyngitis, unspecified etiology  Acute upper respiratory infection  Nasal congestion  Lack of concentration -     Ambulatory referral to Psychiatry  Other orders -     azithromycin (ZITHROMAX) 250 MG tablet; Sig as indicated    Patient Instructions       IF you received an x-ray today, you will receive an invoice from [facility]. Please contact [facility] at [phone] with questions or concerns regarding your invoice.   IF you received labwork today, you will receive an invoice from [place]. Please contact [facility] at [phone] with questions or concerns regarding your invoice.   Our billing staff will not be able to assist you with questions regarding bills from these companies.  You will be contacted with the lab results as soon as they are available. The fastest way to get your results is to activate your My Chart account. Instructions are located on the last page of this paperwork. If you have not heard from [place] regarding the results in 2 weeks, please contact this office.      Sore Throat When you have a sore throat, your throat may:  Hurt.  Burn.  Feel irritated.  Feel scratchy. Many things can cause a sore throat, including:  An infection.  Allergies.  Dryness in the air.  Smoke or  pollution.  Gastroesophageal reflux disease (GERD).  A tumor. A sore throat can be the first sign of another sickness. It can happen with other problems, like coughing or a fever. Most sore throats go away without treatment. Follow these instructions at home:  Take over-the-counter medicines only as told by your doctor.  Drink enough fluids to keep your pee (urine) clear or pale yellow.  Rest when you feel you need to.  To help with pain, try:  Sipping warm liquids, such as broth, herbal tea, or warm water.  Eating or drinking cold or frozen liquids, such as frozen ice pops.  Gargling with a salt-water mixture 3-4 times a day or as needed. To make a salt-water mixture, add -1 tsp of salt in 1 cup of warm water. Mix it until you cannot see the salt anymore.  Sucking on hard candy or throat lozenges.  Putting a cool-mist humidifier in your bedroom at night.  Sitting in the bathroom with the door closed for 5-10 minutes while you run hot water in the shower.  Do not use any tobacco products, such as cigarettes, chewing tobacco, and e-cigarettes. If you need help quitting, ask your doctor. Contact a doctor if:  You have a fever for more than 2-3 days.  You keep having symptoms for more than 2-3 days.  Your throat does not get better in 7 days.  You have a fever and your symptoms suddenly get worse. Get help right away if:  You have trouble breathing.  You cannot swallow fluids, soft foods, or your saliva.  You have swelling in your throat or neck that gets worse.  You keep feeling like you are going to throw up (vomit).  You keep throwing up. This information is not intended to replace advice given to you by your health care provider. Make sure you discuss any questions you have with your health care provider. Document Released: 08/03/2008 Document Revised: 06/20/2016 Document Reviewed: 08/15/2015 Elsevier Interactive Patient Education  2017 Elsevier  Inc.      Agustina Caroli, MD [facility]

## 2017-01-31 ENCOUNTER — Telehealth: Payer: Self-pay | Admitting: *Deleted

## 2017-01-31 NOTE — Telephone Encounter (Signed)
Spoke with patient regarding 04 recall. Patient states she will call today and schedule and call me back with the date. -eh

## 2017-02-03 ENCOUNTER — Other Ambulatory Visit: Payer: Self-pay

## 2017-02-03 ENCOUNTER — Other Ambulatory Visit: Payer: Self-pay | Admitting: Obstetrics and Gynecology

## 2017-02-03 DIAGNOSIS — N63 Unspecified lump in unspecified breast: Secondary | ICD-10-CM

## 2017-02-09 NOTE — Telephone Encounter (Signed)
Patient is scheduled 02-21-17 -eh

## 2017-02-21 ENCOUNTER — Ambulatory Visit
Admission: RE | Admit: 2017-02-21 | Discharge: 2017-02-21 | Disposition: A | Discharge status: Home / Self Care | Payer: BLUE CROSS/BLUE SHIELD | Source: Ambulatory Visit | Attending: Obstetrics and Gynecology | Admitting: Obstetrics and Gynecology

## 2017-02-21 DIAGNOSIS — N6489 Other specified disorders of breast: Secondary | ICD-10-CM | POA: Diagnosis not present

## 2017-02-21 DIAGNOSIS — N63 Unspecified lump in unspecified breast: Secondary | ICD-10-CM

## 2017-04-18 ENCOUNTER — Encounter: Payer: Self-pay | Admitting: Emergency Medicine

## 2017-04-18 ENCOUNTER — Ambulatory Visit (INDEPENDENT_AMBULATORY_CARE_PROVIDER_SITE_OTHER): Payer: BLUE CROSS/BLUE SHIELD | Admitting: Emergency Medicine

## 2017-04-18 VITALS — BP 133/87 | HR 75 | Temp 98.9°F | Resp 16 | Ht 62.25 in | Wt 131.4 lb

## 2017-04-18 DIAGNOSIS — L03032 Cellulitis of left toe: Secondary | ICD-10-CM

## 2017-04-18 MED ORDER — CEPHALEXIN 500 MG PO CAPS: 500.0000 mg | ORAL_CAPSULE | Freq: Three times a day (TID) | ORAL | 0 refills | Status: AC

## 2017-04-18 NOTE — Progress Notes (Signed)
Anne Franco 26 y.o.   Chief Complaint  Patient presents with  . Toe Pain    left foot toe next to big toe, red,swollen and itchy, used neosporin w/ bandaid and used peroxide on it this morning, hurts to walk on it    HISTORY OF PRESENT ILLNESS: This is a 26 y.o. female complaining of swelling and redness to left second toe; possible insect bite.  HPI   Prior to Admission medications   Medication Sig Start Date End Date Taking? Authorizing Provider  BIOTIN PO Take by mouth.   Yes [provider]  etonogestrel-ethinyl estradiol (NUVARING) 0.12-0.015 MG/24HR vaginal ring INSERT 1 RING VAGINALLY AND LEAVE IN FOR 3 WEEKS, THEN OUT FOR 1 WEEK 08/05/16  Yes Salvadore Dom, MD  fluticasone [facility]) 50 MCG/ACT nasal spray Place 2 sprays into both nostrils at bedtime. 07/21/15  Yes Jaynee Eagles, PA-C  azithromycin [facility]) 250 MG tablet Sig as indicated Patient not taking: Reported on 04/18/2017 01/17/17   Horald Pollen, MD  Cyanocobalamin (VITAMIN B 12 PO) Take by mouth.    [provider]    No Known Allergies  Patient Active Problem List   Diagnosis Date Noted  . Acute pharyngitis 01/17/2017  . Acute upper respiratory infection 01/17/2017  . Nasal congestion 01/17/2017  . Lack of concentration 01/17/2017  . Dysplasia of cervix, low grade (CIN 1) 08/19/2015  . Cough 07/21/2015  . Sinusitis 07/21/2015  . Seasonal allergies 07/21/2015  . Contraception 12/18/2011  . Recurrent otitis media 12/18/2011    No past medical history on file.  Past Surgical History:  Procedure Laterality Date  . TYMPANOSTOMY TUBE PLACEMENT      Social History   Social History  . Marital status: Single    Spouse name: N/A  . Number of children: N/A  . Years of education: N/A   Occupational History  . Not on file.   Social History Main Topics  . Smoking status: Former Smoker    Years: 0.50  . Smokeless tobacco: Never Used  . Alcohol use 0.6 - 1.2 oz/week      1 - 2 Standard drinks or equivalent per week  . Drug use: No  . Sexual activity: Yes    Partners: Male    Birth control/ protection: Inserts   Other Topics Concern  . Not on file   Social History Narrative  . No narrative on file    Family History  Problem Relation Age of Onset  . Hyperlipidemia Mother   . Hypertension Mother   . Prostate cancer Father      Review of Systems  Constitutional: Negative for chills and fever.  Respiratory: Negative for shortness of breath.   Gastrointestinal: Negative for nausea and vomiting.  Neurological: Negative for dizziness and headaches.  All other systems reviewed and are negative.  Vitals:   04/18/17 1209  BP: 133/87  Pulse: 75  Resp: 16  Temp: 98.9 F (37.2 C)     Physical Exam  Constitutional: She is oriented to person, place, and time. She appears well-developed and well-nourished.  HENT:  Head: Normocephalic and atraumatic.  Eyes: EOM are normal. Pupils are equal, round, and reactive to light.  Neck: Normal range of motion. Neck supple.  Cardiovascular: Normal rate.   Pulmonary/Chest: Effort normal.  Musculoskeletal:  Left second toe: +puncture wound with surrounding erythema and swelling c/w infection. No visible FB identified.  Neurological: She is alert and oriented to person, place, and time.  Skin: Skin is  warm and dry. Capillary refill takes less than 2 seconds.  Psychiatric: She has a normal mood and affect. Her behavior is normal.  Vitals reviewed.    ASSESSMENT & PLAN: [name] was seen today for toe pain.  Diagnoses and all orders for this visit:  Cellulitis of second toe of left foot  Other orders -     cephALEXin (KEFLEX) 500 MG capsule; Take 1 capsule (500 mg total) by mouth 3 (three) times daily.    Patient Instructions       IF you received an x-ray today, you will receive an invoice from [facility]. Please contact [facility] at [phone] with questions or  concerns regarding your invoice.   IF you received labwork today, you will receive an invoice from [place]. Please contact [facility] at [phone] with questions or concerns regarding your invoice.   Our billing staff will not be able to assist you with questions regarding bills from these companies.  You will be contacted with the lab results as soon as they are available. The fastest way to get your results is to activate your My Chart account. Instructions are located on the last page of this paperwork. If you have not heard from [place] regarding the results in 2 weeks, please contact this office.     Cellulitis, Adult Cellulitis is a skin infection. The infected area is usually red and sore. This condition occurs most often in the arms and lower legs. It is very important to get treated for this condition. Follow these instructions at home:  Take over-the-counter and prescription medicines only as told by your doctor.  If you were prescribed an antibiotic medicine, take it as told by your doctor. Do not stop taking the antibiotic even if you start to feel better.  Drink enough fluid to keep your pee (urine) clear or pale yellow.  Do not touch or rub the infected area.  Raise (elevate) the infected area above the level of your heart while you are sitting or lying down.  Place warm or cold wet cloths (warm or cold compresses) on the infected area. Do this as told by your doctor.  Keep all follow-up visits as told by your doctor. This is important. These visits let your doctor make sure your infection is not getting worse. Contact a doctor if:  You have a fever.  Your symptoms do not get better after 1-2 days of treatment.  Your bone or joint under the infected area starts to hurt after the skin has healed.  Your infection comes back. This can happen in the same area or another area.  You have a swollen bump in the infected area.  You have new symptoms.  You feel ill and also  have muscle aches and pains. Get help right away if:  Your symptoms get worse.  You feel very sleepy.  You throw up (vomit) or have watery poop (diarrhea) for a long time.  There are red streaks coming from the infected area.  Your red area gets larger.  Your red area turns darker. This information is not intended to replace advice given to you by your health care provider. Make sure you discuss any questions you have with your health care provider. Document Released: 04/12/2008 Document Revised: 04/01/2016 Document Reviewed: 09/03/2015 Elsevier Interactive Patient Education  2018 Elsevier Inc.      Agustina Caroli, MD [facility]

## 2017-04-18 NOTE — Patient Instructions (Addendum)
     IF you received an x-ray today, you will receive an invoice from [facility]. Please contact [facility] at [phone] with questions or concerns regarding your invoice.   IF you received labwork today, you will receive an invoice from [facility]. Please contact [facility] at [phone] with questions or concerns regarding your invoice.   Our billing staff will not be able to assist you with questions regarding bills from these companies.  You will be contacted with the lab results as soon as they are available. The fastest way to get your results is to activate your My Chart account. Instructions are located on the last page of this paperwork. If you have not heard from us regarding the results in 2 weeks, please contact this office.      Cellulitis, Adult Cellulitis is a skin infection. The infected area is usually red and sore. This condition occurs most often in the arms and lower legs. It is very important to get treated for this condition. Follow these instructions at home:  Take over-the-counter and prescription medicines only as told by your doctor.  If you were prescribed an antibiotic medicine, take it as told by your doctor. Do not stop taking the antibiotic even if you start to feel better.  Drink enough fluid to keep your pee (urine) clear or pale yellow.  Do not touch or rub the infected area.  Raise (elevate) the infected area above the level of your heart while you are sitting or lying down.  Place warm or cold wet cloths (warm or cold compresses) on the infected area. Do this as told by your doctor.  Keep all follow-up visits as told by your doctor. This is important. These visits let your doctor make sure your infection is not getting worse. Contact a doctor if:  You have a fever.  Your symptoms do not get better after 1-2 days of treatment.  Your bone or joint under the infected area starts to hurt after the skin has healed.  Your  infection comes back. This can happen in the same area or another area.  You have a swollen bump in the infected area.  You have new symptoms.  You feel ill and also have muscle aches and pains. Get help right away if:  Your symptoms get worse.  You feel very sleepy.  You throw up (vomit) or have watery poop (diarrhea) for a long time.  There are red streaks coming from the infected area.  Your red area gets larger.  Your red area turns darker. This information is not intended to replace advice given to you by your health care provider. Make sure you discuss any questions you have with your health care provider. Document Released: 04/12/2008 Document Revised: 04/01/2016 Document Reviewed: 09/03/2015 Elsevier Interactive Patient Education  2018 Elsevier Inc.  

## 2017-04-25 DIAGNOSIS — R41840 Attention and concentration deficit: Secondary | ICD-10-CM | POA: Diagnosis not present

## 2017-04-25 DIAGNOSIS — F902 Attention-deficit hyperactivity disorder, combined type: Secondary | ICD-10-CM | POA: Diagnosis not present

## 2017-04-25 DIAGNOSIS — Z79899 Other long term (current) drug therapy: Secondary | ICD-10-CM | POA: Diagnosis not present

## 2017-04-25 DIAGNOSIS — F419 Anxiety disorder, unspecified: Secondary | ICD-10-CM | POA: Diagnosis not present

## 2017-08-14 ENCOUNTER — Other Ambulatory Visit: Payer: Self-pay | Admitting: Obstetrics and Gynecology

## 2017-08-15 NOTE — Telephone Encounter (Signed)
Medication refill request: Nuvaring  Last AEX:  08-05-16 Next AEX: 08-29-17 Last MMG (if hormonal medication request): 02-21-17 Left breast ultrasound in 6 months. Refill authorized: Please advise

## 2017-08-29 ENCOUNTER — Ambulatory Visit (INDEPENDENT_AMBULATORY_CARE_PROVIDER_SITE_OTHER): Payer: BLUE CROSS/BLUE SHIELD | Admitting: Obstetrics and Gynecology

## 2017-08-29 ENCOUNTER — Encounter: Payer: Self-pay | Admitting: Obstetrics and Gynecology

## 2017-08-29 ENCOUNTER — Other Ambulatory Visit (HOSPITAL_COMMUNITY)
Admission: RE | Admit: 2017-08-29 | Discharge: 2017-08-29 | Disposition: A | Discharge status: Home / Self Care | Payer: BLUE CROSS/BLUE SHIELD | Source: Ambulatory Visit | Attending: Obstetrics and Gynecology | Admitting: Obstetrics and Gynecology

## 2017-08-29 VITALS — BP 102/58 | HR 80 | Resp 16 | Ht 62.5 in | Wt 135.0 lb

## 2017-08-29 DIAGNOSIS — Z124 Encounter for screening for malignant neoplasm of cervix: Secondary | ICD-10-CM | POA: Diagnosis not present

## 2017-08-29 DIAGNOSIS — Z01419 Encounter for gynecological examination (general) (routine) without abnormal findings: Secondary | ICD-10-CM

## 2017-08-29 DIAGNOSIS — N6320 Unspecified lump in the left breast, unspecified quadrant: Secondary | ICD-10-CM

## 2017-08-29 DIAGNOSIS — Z3044 Encounter for surveillance of vaginal ring hormonal contraceptive device: Secondary | ICD-10-CM

## 2017-08-29 DIAGNOSIS — Z0000 Encounter for general adult medical examination without abnormal findings: Secondary | ICD-10-CM | POA: Diagnosis not present

## 2017-08-29 DIAGNOSIS — R87619 Unspecified abnormal cytological findings in specimens from cervix uteri: Secondary | ICD-10-CM | POA: Insufficient documentation

## 2017-08-29 MED ORDER — ETONOGESTREL-ETHINYL ESTRADIOL 0.12-0.015 MG/24HR VA RING: VAGINAL_RING | VAGINAL | 3 refills | Status: DC

## 2017-08-29 NOTE — Progress Notes (Signed)
26 y.o. G0P0000 SingleCaucasianF here for annual exam.  H/O CIN I in 2016.  normal pap last year with negative HPV. She is happy with her nuvaring.  Sexually active, same long partner. Just moved in together. No dyspareunia.  Period Cycle (Days): 84 Period Duration (Days): 2 days  Period Pattern: Regular Menstrual Flow: Light Menstrual Control: Tampon Menstrual Control Change Freq (Hours): changes tampon every 4-5 hours  Dysmenorrhea: (!) Mild Dysmenorrhea Symptoms: Cramping  Patient's last menstrual period was 08/08/2017.          Sexually active: Yes.    The current method of family planning is NuvaRing vaginal inserts.    Exercising: Yes.    cardio/ weights Smoker:  Former smoker   Health Maintenance: Pap:  08-05-16 WNL NEG HR HPV 08-04-15 WNL NEG HR HPV History of abnormal Pap:  Yes HX of LSIL, suspicious for HSIL  2015, didn't have colpo until 2016, +CIN I MMG:  02-11-17 recommended 6 month F/U  Left breast U/S Colonoscopy:  Never BMD:   Never TDaP:  2017 Gardasil: completed all 3    reports that she has quit smoking. She quit after 0.50 years of use. She has never used smokeless tobacco. She reports that she drinks about 0.6 - 1.2 oz of alcohol per week . She reports that she does not use drugs.Works as a Emergency planning/management officer.   No past medical history on file.  Past Surgical History:  Procedure Laterality Date  . TYMPANOSTOMY TUBE PLACEMENT      Current Outpatient Prescriptions  Medication Sig Dispense Refill  . BIOTIN PO Take by mouth.    . Cyanocobalamin (VITAMIN B 12 PO) Take by mouth.    . fluticasone (FLONASE) 50 MCG/ACT nasal spray Place 2 sprays into both nostrils at bedtime. 16 g 11  . NUVARING 0.12-0.015 MG/24HR vaginal ring UNWRAP AND INSERT 1 RING VAGINALLY FOR 3 WEEKS IN AND 1 WEEK OUT 1 each 0   No current facility-administered medications for this visit.     Family History  Problem Relation Age of Onset  . Hyperlipidemia Mother   . Hypertension Mother   .  Prostate cancer Father     Review of Systems  Constitutional: Negative.   HENT: Negative.   Eyes: Negative.   Respiratory: Negative.   Cardiovascular: Negative.   Gastrointestinal: Negative.   Endocrine: Negative.   Genitourinary: Negative.   Musculoskeletal: Negative.   Skin: Negative.   Allergic/Immunologic: Negative.   Neurological: Negative.   Psychiatric/Behavioral: Negative.     Exam:   BP (!) 102/58 (BP Location: Right Arm, Patient Position: Sitting, Cuff Size: Normal)   Pulse 80   Resp 16   Ht 5' 2.5" (1.588 m)   Wt 135 lb (61.2 kg)   LMP 08/08/2017   BMI 24.30 kg/m   Weight change: @WEIGHTCHANGE @ Height:   Height: 5' 2.5" (158.8 cm)  Ht Readings from Last 3 Encounters:  08/29/17 5' 2.5" (1.588 m)  04/18/17 5' 2.25" (1.581 m)  01/17/17 5\' 2"  (1.575 m)    General appearance: alert, cooperative and appears stated age Head: Normocephalic, without obvious abnormality, atraumatic Neck: no adenopathy, supple, symmetrical, trachea midline and thyroid normal to inspection and palpation Lungs: clear to auscultation bilaterally Cardiovascular: regular rate and rhythm Breasts: in the left breast at 12 o'clock is a just under one cm mobile, bean shaped lump, not tender. No other lumps in either breast, no skin changes.  Abdomen: soft, non-tender; non distended,  no masses,  no organomegaly Extremities: extremities  normal, atraumatic, no cyanosis or edema Skin: Skin color, texture, turgor normal. No rashes or lesions Lymph nodes: Cervical, supraclavicular, and axillary nodes normal. No abnormal inguinal nodes palpated Neurologic: Grossly normal   Pelvic: External genitalia:  no lesions              Urethra:  normal appearing urethra with no masses, tenderness or lesions              Bartholins and Skenes: normal                 Vagina: normal appearing vagina with normal color and discharge, no lesions              Cervix: no cervical motion tenderness and no lesions                Bimanual Exam:  Uterus:  normal size, contour, position, consistency, mobility, non-tender              Adnexa: no mass, fullness, tenderness               Rectovaginal: Confirms               Anus:  normal sphincter tone, no lesions  Chaperone was present for exam.  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

## 2017-08-29 NOTE — Patient Instructions (Signed)

## 2017-08-30 LAB — CBC
Hematocrit: 39.8 % (ref 34.0–46.6)
Hemoglobin: 13.4 g/dL (ref 11.1–15.9)
MCH: 30 pg (ref 26.6–33.0)
MCHC: 33.7 g/dL (ref 31.5–35.7)
MCV: 89 fL (ref 79–97)
Platelets: 356 10*3/uL (ref 150–379)
RBC: 4.47 x10E6/uL (ref 3.77–5.28)
RDW: 13 % (ref 12.3–15.4)
WBC: 8.4 10*3/uL (ref 3.4–10.8)

## 2017-08-30 LAB — COMPREHENSIVE METABOLIC PANEL
ALK PHOS: 53 IU/L (ref 39–117)
ALT: 15 IU/L (ref 0–32)
AST: 17 IU/L (ref 0–40)
Albumin/Globulin Ratio: 1.8 (ref 1.2–2.2)
Albumin: 4.1 g/dL (ref 3.5–5.5)
BILIRUBIN TOTAL: 0.7 mg/dL (ref 0.0–1.2)
BUN/Creatinine Ratio: 9 (ref 9–23)
BUN: 7 mg/dL (ref 6–20)
CO2: 22 mmol/L (ref 20–29)
CREATININE: 0.82 mg/dL (ref 0.57–1.00)
Calcium: 9.1 mg/dL (ref 8.7–10.2)
Chloride: 104 mmol/L (ref 96–106)
GFR calc Af Amer: 113 mL/min/{1.73_m2} (ref >59)
GFR, EST NON AFRICAN AMERICAN: 98 mL/min/{1.73_m2} (ref >59)
GLOBULIN, TOTAL: 2.3 g/dL (ref 1.5–4.5)
GLUCOSE: 77 mg/dL (ref 65–99)
Potassium: 4.2 mmol/L (ref 3.5–5.2)
Sodium: 142 mmol/L (ref 134–144)
Total Protein: 6.4 g/dL (ref 6.0–8.5)

## 2017-08-31 ENCOUNTER — Telehealth: Payer: Self-pay

## 2017-08-31 DIAGNOSIS — R87619 Unspecified abnormal cytological findings in specimens from cervix uteri: Secondary | ICD-10-CM

## 2017-08-31 LAB — CYTOLOGY - PAP: HPV: NOT DETECTED

## 2017-08-31 NOTE — Telephone Encounter (Signed)
Spoke with patient results given. Patient verbalizes understanding. Patient is using a Nuvraing continuously. Colposcopy scheduled for 09/19/2017 at 9 am with Dr.Jertson.  Instructions given. Motrin 800 mg po x , one hour before appointment with food. Make sure to eat a meal before appointment and drink plenty of fluids. Patient verbalized understanding and will call to reschedule if will be on menses or has any concerns regarding pregnancy.Patient agreeable and verbalized understanding of all instructions. Order placed for precert.  Routing to provider for final review. Patient agreeable to disposition. Will close encounter.

## 2017-08-31 NOTE — Telephone Encounter (Signed)
-----   Message from Anne Dom, MD sent at 08/31/2017  1:34 PM EDT ----- Please let the patient know that she has atypical glandular cells on her pap with negative HPV testing. This is different than her prior abnormal paps (h/o CIN I), but she does need to come in for a colposcopy.

## 2017-09-19 ENCOUNTER — Ambulatory Visit: Payer: BLUE CROSS/BLUE SHIELD | Admitting: Obstetrics and Gynecology

## 2017-09-19 ENCOUNTER — Encounter: Payer: Self-pay | Admitting: Obstetrics and Gynecology

## 2017-09-19 VITALS — BP 120/70 | HR 84 | Resp 14 | Wt 133.0 lb

## 2017-09-19 DIAGNOSIS — Z01812 Encounter for preprocedural laboratory examination: Secondary | ICD-10-CM | POA: Diagnosis not present

## 2017-09-19 DIAGNOSIS — N888 Other specified noninflammatory disorders of cervix uteri: Secondary | ICD-10-CM | POA: Diagnosis not present

## 2017-09-19 DIAGNOSIS — R87619 Unspecified abnormal cytological findings in specimens from cervix uteri: Secondary | ICD-10-CM | POA: Diagnosis not present

## 2017-09-19 LAB — POCT URINE PREGNANCY: PREG TEST UR: NEGATIVE

## 2017-09-19 NOTE — Progress Notes (Signed)
GYNECOLOGY  VISIT   HPI: 26 y.o.   Single  Caucasian  female   [id number] with Patient's last menstrual period was 08/08/2017.   here for a colposcopy. Recent pap with atypical glandular cells with negative HPV. Prior h/o CIN I in 2016, no treatment. Negative pap last year.   GYNECOLOGIC HISTORY: Patient's last menstrual period was 08/08/2017. Contraception:Nuvaring Menopausal hormone therapy: none         OB History    Gravida Para Term Preterm AB Living   0 0 0 0 0 0   SAB TAB Ectopic Multiple Live Births   0 0 0 0           Patient Active Problem List   Diagnosis Date Noted  . Cellulitis of second toe of left foot 04/18/2017  . Acute pharyngitis 01/17/2017  . Acute upper respiratory infection 01/17/2017  . Nasal congestion 01/17/2017  . Lack of concentration 01/17/2017  . Dysplasia of cervix, low grade (CIN 1) 08/19/2015  . Cough 07/21/2015  . Sinusitis 07/21/2015  . Seasonal allergies 07/21/2015  . Contraception 12/18/2011  . Recurrent otitis media 12/18/2011    History reviewed. No pertinent past medical history.  Past Surgical History:  Procedure Laterality Date  . TYMPANOSTOMY TUBE PLACEMENT      Current Outpatient Medications  Medication Sig Dispense Refill  . BIOTIN PO Take by mouth.    . Cyanocobalamin (VITAMIN B 12 PO) Take by mouth.    . etonogestrel-ethinyl estradiol (NUVARING) 0.12-0.015 MG/24HR vaginal ring UNWRAP AND INSERT 1 RING VAGINALLY FOR 3 WEEKS IN AND 1 WEEK OUT 3 each 3  . fluticasone (FLONASE) 50 MCG/ACT nasal spray Place 2 sprays into both nostrils at bedtime. 16 g 11   No current facility-administered medications for this visit.      ALLERGIES: Patient has no known allergies.  Family History  Problem Relation Age of Onset  . Hyperlipidemia Mother   . Hypertension Mother   . Prostate cancer Father     Social History   Socioeconomic History  . Marital status: Single    Spouse name: Not on file  . Number of children: Not on file   . Years of education: Not on file  . Highest education level: Not on file  Social Needs  . Financial resource strain: Not on file  . Food insecurity - worry: Not on file  . Food insecurity - inability: Not on file  . Transportation needs - medical: Not on file  . Transportation needs - non-medical: Not on file  Occupational History  . Not on file  Tobacco Use  . Smoking status: Former Smoker    Years: 0.50  . Smokeless tobacco: Never Used  Substance and Sexual Activity  . Alcohol use: Yes    Alcohol/week: 0.6 - 1.2 oz    Types: 1 - 2 Standard drinks or equivalent per week  . Drug use: No  . Sexual activity: Yes    Partners: Male    Birth control/protection: Inserts  Other Topics Concern  . Not on file  Social History Narrative  . Not on file    Review of Systems  Constitutional: Negative.   HENT: Negative.   Eyes: Negative.   Respiratory: Negative.   Cardiovascular: Negative.   Gastrointestinal: Negative.   Genitourinary: Negative.   Skin: Negative.   Neurological: Negative.   Endo/Heme/Allergies: Negative.   Psychiatric/Behavioral: Negative.     PHYSICAL EXAMINATION:    BP 120/70 (BP Location: Right Arm, Patient Position: Sitting, Cuff  Size: Normal)   Pulse 84   Resp 14   Wt 133 lb (60.3 kg)   LMP 08/08/2017   BMI 23.94 kg/m     General appearance: alert, cooperative and appears stated age  Pelvic: External genitalia:  no lesions              Urethra:  normal appearing urethra with no masses, tenderness or lesions              Bartholins and Skenes: normal                 Vagina: normal appearing vagina with normal color and discharge, no lesions              Cervix: no lesions  Colposcopy: satisfactory, wide TZone, mild aceto-white changes, biopsy taken at 7 o'clock. ECC done. Negative lugols examination of the upper vagina. Biopsy site treated with silver nitrate and monsels.   Chaperone was present for exam.  ASSESSMENT AGUS pap with negative  HPV    PLAN Colposcopy with biopsy and ECC Further plans depending on the results   An After Visit Summary was printed and given to the patient.

## 2017-09-19 NOTE — Patient Instructions (Signed)

## 2017-09-22 ENCOUNTER — Telehealth: Payer: Self-pay

## 2017-09-22 NOTE — Telephone Encounter (Signed)
-----   Message from Salvadore Dom, MD sent at 09/20/2017  5:38 PM EST ----- Please inform the patient that her cervical biopsy returned with some atypia suggestive of hpv, no clear dysplasia was seen. Her ECC was negative. There is mention of decidual change on the biopsy, UPT is negative, she is on the nuvaring which is the likely cause.  She should have a f/u pap with hpv in one year

## 2017-09-23 NOTE — Telephone Encounter (Signed)
Late entry. Spoke with patient on 09/22/2017. Results given. Patient verbalized understanding. 08 recall placed. Encounter closed.

## 2017-10-03 ENCOUNTER — Telehealth: Payer: Self-pay | Admitting: Obstetrics and Gynecology

## 2017-10-03 DIAGNOSIS — Z23 Encounter for immunization: Secondary | ICD-10-CM | POA: Diagnosis not present

## 2017-10-03 NOTE — Telephone Encounter (Signed)
Spoke with patient, advised as seen below per Dr. Talbert Nan. Patient states she will f/u with dermatology. Aware to return call to office with any other questions/concerns. Patient verbalizes understanding and is agreeable.   Routing to provider for final review. Patient is agreeable to disposition. Will close encounter.

## 2017-10-03 NOTE — Telephone Encounter (Signed)
Patient has some questions about her hormonal changes.  Wants to know if there is anything she can do.  States she is having breakouts and sluggish.

## 2017-10-03 NOTE — Telephone Encounter (Signed)
Spoke with patient. Reports increased acne around mouth and feeling groggy for the last couple of weeks. States she was advised of colpo results recently, possibly r/t hormone imbalance. Patient asking if any additional recommendations for hormonal changes causing increase in acne?   Patient states she has used Nuvaring for 10 years, does not desire to change. No recent changes in eating habits or increased stress.   Last seen by dermatologist 1 year ago, states she discussed hormone imbalance as possible cause of increased acne, was prescribed a topical cream.   Advised patient would review with Dr. Talbert Nan and return call with recommendations, patient is agreeable.   Dr. Talbert Nan -please advise?

## 2017-10-03 NOTE — Telephone Encounter (Signed)
I don't think her abnormal pap is from a hormonal imbalance, just that you can see hormonal effects on the biopsy. I don't know why she is having acne all of a sudden. If she doesn't want to change from the nuvaring to an OCP, I would suggest she f/u with dermatology.

## 2017-10-05 DIAGNOSIS — L700 Acne vulgaris: Secondary | ICD-10-CM | POA: Diagnosis not present

## 2017-10-11 ENCOUNTER — Other Ambulatory Visit: Payer: Self-pay | Admitting: Obstetrics and Gynecology

## 2017-10-11 ENCOUNTER — Telehealth: Payer: Self-pay | Admitting: *Deleted

## 2017-10-11 DIAGNOSIS — D242 Benign neoplasm of left breast: Secondary | ICD-10-CM

## 2017-10-11 NOTE — Telephone Encounter (Signed)
Left message to call regarding 04 recall. Patient needs F/U  breast U/S

## 2017-10-11 NOTE — Telephone Encounter (Signed)
Spoke with patient and she is going to call and schedule now. Will follow up next week

## 2017-10-13 NOTE — Telephone Encounter (Signed)
Patient scheduled for 10-17-17 -eh

## 2017-10-17 ENCOUNTER — Other Ambulatory Visit: Payer: BLUE CROSS/BLUE SHIELD

## 2017-10-24 ENCOUNTER — Ambulatory Visit
Admission: RE | Admit: 2017-10-24 | Discharge: 2017-10-24 | Disposition: A | Discharge status: Home / Self Care | Payer: BLUE CROSS/BLUE SHIELD | Source: Ambulatory Visit | Attending: Obstetrics and Gynecology | Admitting: Obstetrics and Gynecology

## 2017-10-24 ENCOUNTER — Other Ambulatory Visit: Payer: Self-pay | Admitting: Obstetrics and Gynecology

## 2017-10-24 DIAGNOSIS — N6489 Other specified disorders of breast: Secondary | ICD-10-CM | POA: Diagnosis not present

## 2017-10-24 DIAGNOSIS — D242 Benign neoplasm of left breast: Secondary | ICD-10-CM

## 2017-12-22 ENCOUNTER — Ambulatory Visit: Payer: BLUE CROSS/BLUE SHIELD | Admitting: Family Medicine

## 2017-12-22 ENCOUNTER — Encounter: Payer: Self-pay | Admitting: Family Medicine

## 2017-12-22 VITALS — BP 140/90 | HR 88 | Temp 98.2°F | Resp 16 | Ht 62.5 in | Wt 135.0 lb

## 2017-12-22 DIAGNOSIS — H9011 Conductive hearing loss, unilateral, right ear, with unrestricted hearing on the contralateral side: Secondary | ICD-10-CM

## 2017-12-22 DIAGNOSIS — H9191 Unspecified hearing loss, right ear: Secondary | ICD-10-CM

## 2017-12-22 DIAGNOSIS — R42 Dizziness and giddiness: Secondary | ICD-10-CM | POA: Diagnosis not present

## 2017-12-22 MED ORDER — PREDNISONE 20 MG PO TABS: 40.0000 mg | ORAL_TABLET | Freq: Every day | ORAL | 0 refills | Status: DC

## 2017-12-22 NOTE — Progress Notes (Signed)
I,Jennifer Gorman,acting as a Education administrator for Wendie Agreste, MD.,have documented all relevant documentation on the behalf of Wendie Agreste, MD,as directed by  Wendie Agreste, MD while in the presence of Wendie Agreste, MD.  Subjective:    Patient ID: Anne Franco, female    DOB: 06-19-1991, 27 y.o.   MRN: [id number]  Chief Complaint  Patient presents with  . Ear Fullness    Bilateral, mostly right ear  . Dizziness    HPI  Anne Franco is a 27 y.o. female who presents to Primary Care at [facility] for ear pain and dizziness that first started Tuesday, 12/20/2017, evening.  Dizziness Tuesday, 12/20/2017, the patient felt "off" and reports feeling off balance yesterday. The end of 10/2017 she has sinus issues, but hasn't been sick since. 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 The patient has had tubes in her ears 2-3 times before. She denies heart palpations.   Ear pain She has been experiencing left ear discomfort for some time. However, last night she was on the phone and noticed she was unable to hear out of her right ear. Since then she has heard a bubbling sound in her right ear. She denies fever, chills or ear drainage.    Patient Active Problem List   Diagnosis Date Noted  . Cellulitis of second toe of left foot 04/18/2017  . Acute pharyngitis 01/17/2017  . Acute upper respiratory infection 01/17/2017  . Nasal congestion 01/17/2017  . Lack of concentration 01/17/2017  . Dysplasia of cervix, low grade (CIN 1) 08/19/2015  . Cough 07/21/2015  . Sinusitis 07/21/2015  . Seasonal allergies 07/21/2015  . Contraception 12/18/2011  . Recurrent otitis media 12/18/2011   History reviewed. No pertinent past medical history. Past Surgical History:  Procedure Laterality Date  . TYMPANOSTOMY TUBE PLACEMENT     No Known Allergies Prior to Admission medications   Medication Sig Start Date End Date Taking? Authorizing Provider  etonogestrel-ethinyl  estradiol (NUVARING) 0.12-0.015 MG/24HR vaginal ring UNWRAP AND INSERT 1 RING VAGINALLY FOR 3 WEEKS IN AND 1 WEEK OUT 08/29/17  Yes Salvadore Dom, MD  fluticasone [facility]) 50 MCG/ACT nasal spray Place 2 sprays into both nostrils at bedtime. 07/21/15  Yes Jaynee Eagles, PA-C  BIOTIN PO Take by mouth.    [provider]  Cyanocobalamin (VITAMIN B 12 PO) Take by mouth.    [provider]   Social History   Socioeconomic History  . Marital status: Single    Spouse name: Not on file  . Number of children: Not on file  . Years of education: Not on file  . Highest education level: Not on file  Social Needs  . Financial resource strain: Not on file  . Food insecurity - worry: Not on file  . Food insecurity - inability: Not on file  . Transportation needs - medical: Not on file  . Transportation needs - non-medical: Not on file  Occupational History  . Not on file  Tobacco Use  . Smoking status: Former Smoker    Years: 0.50  . Smokeless tobacco: Never Used  Substance and Sexual Activity  . Alcohol use: Yes    Alcohol/week: 0.6 - 1.2 oz    Types: 1 - 2 Standard drinks or equivalent per week  . Drug use: No  . Sexual activity: Yes    Partners: Male    Birth control/protection: Inserts  Other Topics Concern  . Not  on file  Social History Narrative  . Not on file      Review of Systems  Constitutional: Negative for chills and fever.  HENT: Positive for ear pain. Negative for ear discharge.   Cardiovascular: Negative for palpitations.  Neurological: Positive for dizziness.   13 point ROS reviewed and negative unless otherwise noted.     Objective:   Physical Exam  Constitutional: She is oriented to person, place, and time. She appears well-developed and well-nourished. No distress.  HENT:  Head: Normocephalic and atraumatic.  Right Ear: Hearing, tympanic membrane, external ear and ear canal normal.  Left Ear: Hearing, tympanic membrane, external ear  and ear canal normal.  Nose: Nose normal.  Mouth/Throat: Oropharynx is clear and moist. No oropharyngeal exudate.  Left: pearly gray, scar at inferior aspect of left tm, minimal cerumen at external canal Right: minimal cerumen. right tm dull, slightly retracted  minimal edema turbinate but no discharge  Weber localizes to the left  Rinne: Right- one conduction only, Left- bone and air conduction   Eyes: Conjunctivae and EOM are normal. Pupils are equal, round, and reactive to light.  Cardiovascular: Normal rate, regular rhythm, normal heart sounds and intact distal pulses.  No murmur heard. Pulmonary/Chest: Effort normal and breath sounds normal. No respiratory distress. She has no wheezes. She has no rhonchi.  Neurological: She is alert and oriented to person, place, and time.  Skin: Skin is warm and dry. No rash noted.  Psychiatric: She has a normal mood and affect. Her behavior is normal.  Vitals reviewed.   Vitals:   12/22/17 1753  BP: 140/90  Pulse: 88  Resp: 16  Temp: 98.2 F (36.8 C)  TempSrc: Oral  SpO2: 97%  Weight: 135 lb (61.2 kg)  Height: 5' 2.5" (1.588 m)        Assessment & Plan:   Anne Franco is a 27 y.o. 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 However Rinne testing indicated more conductive loss. Associated fluid feeling may be related to serous otitis or eustachian tube dysfunction. Eardrum did appear slightly retracted, dull, but no other signs of infection at present. Episodic dizziness may be due to middle ear effusion. Denies pain, tinnitus, no rash seen. Differential includes serous otitis/eustachian tube dysfunction, most likely acute  sensorineural hearing loss or labyrinthitis.  -Start Sudafed over-the-counter temporarily, prednisone 40 mg daily for 5 days, maintain hydration, and if not improving in the next 3-4 days, would recommend ear nose and throat evaluation. RTC precautions if worsening sooner. Side effects discussed for prednisone and Sudafed  Meds ordered this encounter  Medications  . predniSONE (DELTASONE) 20 MG tablet    Sig: Take 2 tablets (40 mg total) by mouth daily with breakfast.    Dispense:  10 tablet    Refill:  0   Patient Instructions    Try over the counter sudafed, and start prednisone for blocked right ear. Make sure you drink plenty of fluids for dizziness. If your symptoms are not improving in 4 days, or any worsening sooner I can refer you to ear nose and throat specialist.   Dizziness Dizziness is a common problem. It is a feeling of unsteadiness or light-headedness. You may feel like you are  about to faint. Dizziness can lead to injury if you stumble or fall. Anyone can become dizzy, but dizziness is more common in older adults. This condition can be caused by a number of things, including medicines, dehydration, or illness. Follow these instructions at home: Eating and drinking  Drink enough fluid to keep your urine clear or pale yellow. This helps to keep you from becoming dehydrated. Try to drink more clear fluids, such as water.  Do not drink alcohol.  Limit your caffeine intake if told to do so by your health care provider. Check ingredients and nutrition facts to see if a food or beverage contains caffeine.  Limit your salt (sodium) intake if told to do so by your health care provider. Check ingredients and nutrition facts to see if a food or beverage contains sodium. Activity  Avoid making quick movements. ? Rise slowly from chairs and steady yourself until you feel okay. ? In the morning, first sit up on the side of the bed. When you feel okay, stand slowly while you hold onto  something until you know that your balance is fine.  If you need to stand in one place for a long time, move your legs often. Tighten and relax the muscles in your legs while you are standing.  Do not drive or use heavy machinery if you feel dizzy.  Avoid bending down if you feel dizzy. Place items in your home so that they are easy for you to reach without leaning over. Lifestyle  Do not use any products that contain nicotine or tobacco, such as cigarettes and e-cigarettes. If you need help quitting, ask your health care provider.  Try to reduce your stress level by using methods such as yoga or meditation. Talk with your health care provider if you need help to manage your stress. General instructions  Watch your dizziness for any changes.  Take over-the-counter and prescription medicines only as told by your health care provider. Talk with your health care provider if you think that your dizziness is caused by a medicine that you are taking.  Tell a friend or a family member that you are feeling dizzy. If he or she notices any changes in your behavior, have this person call your health care provider.  Keep all follow-up visits as told by your health care provider. This is important. Contact a health care provider if:  Your dizziness does not go away.  Your dizziness or light-headedness gets worse.  You feel nauseous.  You have reduced hearing.  You have new symptoms.  You are unsteady on your feet or you feel like the room is spinning. Get help right away if:  You vomit or have diarrhea and are unable to eat or drink anything.  You have problems talking, walking, swallowing, or using your arms, hands, or legs.  You feel generally weak.  You are not thinking clearly or you have trouble forming sentences. It may take a friend or family member to notice this.  You have chest pain, abdominal pain, shortness of breath, or sweating.  Your vision changes.  You have any  bleeding.  You have a severe headache.  You have neck pain or a stiff neck.  You have a fever. These symptoms may represent a serious problem that is an emergency. Do not wait to see if the symptoms will go away. Get medical help right away. Call your local emergency services (911 in the [place] Do not drive yourself to the hospital. Summary  Dizziness is a feeling of unsteadiness or light-headedness. This condition can be caused by a number of things, including medicines, dehydration, or illness.  Anyone can become dizzy, but dizziness is more common in older adults.  Drink enough fluid to keep your urine clear or pale yellow. Do not drink alcohol.  Avoid making quick movements if you feel dizzy. Monitor your dizziness for any changes. This information is not intended to replace advice given to you by your health care provider. Make sure you discuss any questions you have with your health care provider. Document Released: 04/20/2001 Document Revised: 11/27/2016 Document Reviewed: 11/27/2016 Elsevier Interactive Patient Education  2018 [employer].   IF you received an x-ray today, you will receive an invoice from [facility]. Please contact [facility] at [phone] with questions or concerns regarding your invoice.   IF you received labwork today, you will receive an invoice from [facility]. Please contact [facility] at [phone] with questions or concerns regarding your invoice.   Our billing staff will not be able to assist you with questions regarding bills from these companies.  You will be contacted with the lab results as soon as they are available. The fastest way to get your results is to activate your My Chart account. Instructions are located on the last page of this paperwork. If you have not heard from [place] regarding the results in 2 weeks, please contact this office.      I personally performed the services described in this documentation, which was  scribed in my presence. The recorded information has been reviewed and considered for accuracy and completeness, addended by me as needed, and agree with information above.  Signed,   Merri Ray, MD [facility] at [place].  12/24/17 10:30 PM

## 2017-12-22 NOTE — Patient Instructions (Addendum)
Try over the counter sudafed, and start prednisone for blocked right ear. Make sure you drink plenty of fluids for dizziness. If your symptoms are not improving in 4 days, or any worsening sooner I can refer you to ear nose and throat specialist.   Dizziness Dizziness is a common problem. It is a feeling of unsteadiness or light-headedness. You may feel like you are about to faint. Dizziness can lead to injury if you stumble or fall. Anyone can become dizzy, but dizziness is more common in older adults. This condition can be caused by a number of things, including medicines, dehydration, or illness. Follow these instructions at home: Eating and drinking  Drink enough fluid to keep your urine clear or pale yellow. This helps to keep you from becoming dehydrated. Try to drink more clear fluids, such as water.  Do not drink alcohol.  Limit your caffeine intake if told to do so by your health care provider. Check ingredients and nutrition facts to see if a food or beverage contains caffeine.  Limit your salt (sodium) intake if told to do so by your health care provider. Check ingredients and nutrition facts to see if a food or beverage contains sodium. Activity  Avoid making quick movements. ? Rise slowly from chairs and steady yourself until you feel okay. ? In the morning, first sit up on the side of the bed. When you feel okay, stand slowly while you hold onto something until you know that your balance is fine.  If you need to stand in one place for a long time, move your legs often. Tighten and relax the muscles in your legs while you are standing.  Do not drive or use heavy machinery if you feel dizzy.  Avoid bending down if you feel dizzy. Place items in your home so that they are easy for you to reach without leaning over. Lifestyle  Do not use any products that contain nicotine or tobacco, such as cigarettes and e-cigarettes. If you need help quitting, ask your health care  provider.  Try to reduce your stress level by using methods such as yoga or meditation. Talk with your health care provider if you need help to manage your stress. General instructions  Watch your dizziness for any changes.  Take over-the-counter and prescription medicines only as told by your health care provider. Talk with your health care provider if you think that your dizziness is caused by a medicine that you are taking.  Tell a friend or a family member that you are feeling dizzy. If he or she notices any changes in your behavior, have this person call your health care provider.  Keep all follow-up visits as told by your health care provider. This is important. Contact a health care provider if:  Your dizziness does not go away.  Your dizziness or light-headedness gets worse.  You feel nauseous.  You have reduced hearing.  You have new symptoms.  You are unsteady on your feet or you feel like the room is spinning. Get help right away if:  You vomit or have diarrhea and are unable to eat or drink anything.  You have problems talking, walking, swallowing, or using your arms, hands, or legs.  You feel generally weak.  You are not thinking clearly or you have trouble forming sentences. It may take a friend or family member to notice this.  You have chest pain, abdominal pain, shortness of breath, or sweating.  Your vision changes.  You have any  bleeding.  You have a severe headache.  You have neck pain or a stiff neck.  You have a fever. These symptoms may represent a serious problem that is an emergency. Do not wait to see if the symptoms will go away. Get medical help right away. Call your local emergency services (911 in the [place] Do not drive yourself to the hospital. Summary  Dizziness is a feeling of unsteadiness or light-headedness. This condition can be caused by a number of things, including medicines, dehydration, or illness.  Anyone can become dizzy,  but dizziness is more common in older adults.  Drink enough fluid to keep your urine clear or pale yellow. Do not drink alcohol.  Avoid making quick movements if you feel dizzy. Monitor your dizziness for any changes. This information is not intended to replace advice given to you by your health care provider. Make sure you discuss any questions you have with your health care provider. Document Released: 04/20/2001 Document Revised: 11/27/2016 Document Reviewed: 11/27/2016 Elsevier Interactive Patient Education  2018 [employer].   IF you received an x-ray today, you will receive an invoice from [facility]. Please contact [facility] at [phone] with questions or concerns regarding your invoice.   IF you received labwork today, you will receive an invoice from [place]. Please contact [facility] at [phone] with questions or concerns regarding your invoice.   Our billing staff will not be able to assist you with questions regarding bills from these companies.  You will be contacted with the lab results as soon as they are available. The fastest way to get your results is to activate your My Chart account. Instructions are located on the last page of this paperwork. If you have not heard from [place] regarding the results in 2 weeks, please contact this office.

## 2018-01-02 DIAGNOSIS — L700 Acne vulgaris: Secondary | ICD-10-CM | POA: Diagnosis not present

## 2018-03-24 ENCOUNTER — Encounter: Payer: Self-pay | Admitting: Family Medicine

## 2018-03-24 ENCOUNTER — Ambulatory Visit: Payer: BLUE CROSS/BLUE SHIELD | Admitting: Family Medicine

## 2018-03-24 ENCOUNTER — Other Ambulatory Visit: Payer: Self-pay

## 2018-03-24 VITALS — BP 122/92 | HR 97 | Temp 98.0°F | Ht 62.5 in | Wt 131.6 lb

## 2018-03-24 DIAGNOSIS — R5383 Other fatigue: Secondary | ICD-10-CM | POA: Diagnosis not present

## 2018-03-24 DIAGNOSIS — H6692 Otitis media, unspecified, left ear: Secondary | ICD-10-CM | POA: Diagnosis not present

## 2018-03-24 DIAGNOSIS — R591 Generalized enlarged lymph nodes: Secondary | ICD-10-CM

## 2018-03-24 LAB — POCT CBC
GRANULOCYTE PERCENT: 61.6 % (ref 37–80)
HCT, POC: 44.6 % (ref 37.7–47.9)
HEMOGLOBIN: 15.2 g/dL (ref 12.2–16.2)
Lymph, poc: 2.9 (ref 0.6–3.4)
MCH: 30.1 pg (ref 27–31.2)
MCHC: 34.1 g/dL (ref 31.8–35.4)
MCV: 88.1 fL (ref 80–97)
MID (CBC): 0.4 (ref 0–0.9)
MPV: 6.2 fL (ref 0–99.8)
POC GRANULOCYTE: 5.2 (ref 2–6.9)
POC LYMPH PERCENT: 34.1 %L (ref 10–50)
POC MID %: 4.3 % (ref 0–12)
Platelet Count, POC: 422 10*3/uL (ref 142–424)
RBC: 5.06 M/uL (ref 4.04–5.48)
RDW, POC: 12.1 %
WBC: 8.5 10*3/uL (ref 4.6–10.2)

## 2018-03-24 MED ORDER — AMOXICILLIN-POT CLAVULANATE 875-125 MG PO TABS
1.0000 | ORAL_TABLET | Freq: Two times a day (BID) | ORAL | 0 refills | Status: DC
Start: 2018-03-24 — End: 2018-09-04

## 2018-03-24 NOTE — Patient Instructions (Addendum)
Start Augmentin 1 pill twice per day.  Avoid any contact sports for now as we discussed as mono is still possible.  I will check a mono test but as we discussed sometimes that can be falsely negative early on in the course of illness.  If swollen lymph nodes at the back of your neck and side of your neck are not improving in the next 1 to 2 weeks, or any worsening symptoms sooner, please return for recheck.  Thanks for coming in today.   Lymphadenopathy Lymphadenopathy refers to swollen or enlarged lymph glands, also called lymph nodes. Lymph glands are part of your body's defense (immune) system, which protects the body from infections, germs, and diseases. Lymph glands are found in many locations in your body, including the neck, underarm, and groin. Many things can cause lymph glands to become enlarged. When your immune system responds to germs, such as viruses or bacteria, infection-fighting cells and fluid build up. This causes the glands to grow in size. Usually, this is not something to worry about. The swelling and any soreness often go away without treatment. However, swollen lymph glands can also be caused by a number of diseases. Your health care provider may do various tests to help determine the cause. If the cause of your swollen lymph glands cannot be found, it is important to monitor your condition to make sure the swelling goes away. Follow these instructions at home: Watch your condition for any changes. The following actions may help to lessen any discomfort you are feeling:  Get plenty of rest.  Take medicines only as directed by your health care provider. Your health care provider may recommend over-the-counter medicines for pain.  Apply moist heat compresses to the site of swollen lymph nodes as directed by your health care provider. This can help reduce any pain.  Check your lymph nodes daily for any changes.  Keep all follow-up visits as directed by your health care provider.  This is important.  Contact a health care provider if:  Your lymph nodes are still swollen after 2 weeks.  Your swelling increases or spreads to other areas.  Your lymph nodes are hard, seem fixed to the skin, or are growing rapidly.  Your skin over the lymph nodes is red and inflamed.  You have a fever.  You have chills.  You have fatigue.  You develop a sore throat.  You have abdominal pain.  You have weight loss.  You have night sweats. Get help right away if:  You notice fluid leaking from the area of the enlarged lymph node.  You have severe pain in any area of your body.  You have chest pain.  You have shortness of breath. This information is not intended to replace advice given to you by your health care provider. Make sure you discuss any questions you have with your health care provider. Document Released: 08/03/2008 Document Revised: 04/01/2016 Document Reviewed: 05/30/2014 Elsevier Interactive Patient Education  2018 [employer].   IF you received an x-ray today, you will receive an invoice from [facility]. Please contact [facility] at [phone] with questions or concerns regarding your invoice.   IF you received labwork today, you will receive an invoice from [place]. Please contact [facility] at [phone] with questions or concerns regarding your invoice.   Our billing staff will not be able to assist you with questions regarding bills from these companies.  You will be contacted with the lab results as soon as they are  available. The fastest way to get your results is to activate your My Chart account. Instructions are located on the last page of this paperwork. If you have not heard from us regarding the results in 2 weeks, please contact this office.      

## 2018-03-24 NOTE — Progress Notes (Signed)
Subjective:  By signing my name below, I, Moises Blood, attest that this documentation has been prepared under the direction and in the presence of Merri Ray, MD. Electronically Signed: Moises Blood, [place]. 03/24/2018 , 1:51 PM .  Patient was seen in Room 10 .   Patient ID: Anne Franco, female    DOB: Oct 19, 1991, 27 y.o.   MRN: [id number] Chief Complaint  Patient presents with  . left ear    left bumps on the back of ear. left ear feels bubbly   HPI Anne Franco is a 27 y.o. female  Patient was previously seen in February for right ear pain and dizziness.   Patient noticed a knot over the left side of the back of her scalp about 10 days ago. She then noticed a smaller bump appear over the left side of her neck. Around the same time, 4-5 days ago, she felt some pressure in her left ear and hasn't been feeling well all week. She's concerned because it feels like the start of an ear infection, but not as sore. She denies hearing loss, or fever. She denies taking medications for this, or applying drops into her ear.   She reports feeling fatigued after work; she ate dinner after work last night, and just laid down on the couch. She denies sore throat, but does mention throat feeling scratchy in the morning. She denies cats at home.   Patient Active Problem List   Diagnosis Date Noted  . Cellulitis of second toe of left foot 04/18/2017  . Acute pharyngitis 01/17/2017  . Acute upper respiratory infection 01/17/2017  . Nasal congestion 01/17/2017  . Lack of concentration 01/17/2017  . Dysplasia of cervix, low grade (CIN 1) 08/19/2015  . Cough 07/21/2015  . Sinusitis 07/21/2015  . Seasonal allergies 07/21/2015  . Contraception 12/18/2011  . Recurrent otitis media 12/18/2011   No past medical history on file. Past Surgical History:  Procedure Laterality Date  . TYMPANOSTOMY TUBE PLACEMENT     No Known Allergies Prior to Admission medications   Medication Sig Start  Date End Date Taking? Authorizing Provider  BIOTIN PO Take by mouth.    [provider]  Cyanocobalamin (VITAMIN B 12 PO) Take by mouth.    [provider]  etonogestrel-ethinyl estradiol (NUVARING) 0.12-0.015 MG/24HR vaginal ring UNWRAP AND INSERT 1 RING VAGINALLY FOR 3 WEEKS IN AND 1 WEEK OUT 08/29/17   Salvadore Dom, MD  fluticasone [facility]) 50 MCG/ACT nasal spray Place 2 sprays into both nostrils at bedtime. 07/21/15   Jaynee Eagles, PA-C  predniSONE (DELTASONE) 20 MG tablet Take 2 tablets (40 mg total) by mouth daily with breakfast. 12/22/17   Wendie Agreste, MD   Social History   Socioeconomic History  . Marital status: Single    Spouse name: Not on file  . Number of children: Not on file  . Years of education: Not on file  . Highest education level: Not on file  Occupational History  . Not on file  Social Needs  . Financial resource strain: Not on file  . Food insecurity:    Worry: Not on file    Inability: Not on file  . Transportation needs:    Medical: Not on file    Non-medical: Not on file  Tobacco Use  . Smoking status: Former Smoker    Years: 0.50  . Smokeless tobacco: Never Used  Substance and Sexual Activity  . Alcohol use: Yes    Alcohol/week: 0.6 -  1.2 oz    Types: 1 - 2 Standard drinks or equivalent per week  . Drug use: No  . Sexual activity: Yes    Partners: Male    Birth control/protection: Inserts  Lifestyle  . Physical activity:    Days per week: Not on file    Minutes per session: Not on file  . Stress: Not on file  Relationships  . Social connections:    Talks on phone: Not on file    Gets together: Not on file    Attends religious service: Not on file    Active member of club or organization: Not on file    Attends meetings of clubs or organizations: Not on file    Relationship status: Not on file  . Intimate partner violence:    Fear of current or ex partner: Not on file    Emotionally abused: Not on file     Physically abused: Not on file    Forced sexual activity: Not on file  Other Topics Concern  . Not on file  Social History Narrative  . Not on file   Review of Systems  Constitutional: Positive for fatigue. Negative for chills, fever and unexpected weight change.  HENT: Positive for ear pain (pressure). Negative for ear discharge and hearing loss.   Respiratory: Negative for cough.   Gastrointestinal: Negative for constipation, diarrhea, nausea and vomiting.  Skin: Negative for rash and wound.  Neurological: Negative for dizziness, weakness and headaches.  Hematological: Positive for adenopathy.       Objective:   Physical Exam  Constitutional: She is oriented to person, place, and time. She appears well-developed and well-nourished. No distress.  HENT:  Head: Normocephalic and atraumatic.  Right ear: slight dull appearance of TM Left ear: light yellow to clear fluid base of TM inferior aspect, no edema, no discharge  Eyes: Pupils are equal, round, and reactive to light. EOM are normal.  Neck: Neck supple.  Cardiovascular: Normal rate.  Pulmonary/Chest: Effort normal. No respiratory distress.  Abdominal: There is no hepatosplenomegaly.  Musculoskeletal: Normal range of motion.  Lymphadenopathy:    She has no axillary adenopathy.       Right: No supraclavicular and no epitrochlear adenopathy present.       Left: No supraclavicular and no epitrochlear adenopathy present.  Small prominent node over left occipital, small node at posterior cervical  Neurological: She is alert and oriented to person, place, and time.  Skin: Skin is warm and dry.  Psychiatric: She has a normal mood and affect. Her behavior is normal.  Nursing note and vitals reviewed.   Vitals:   03/24/18 1334  BP: (!) 122/92  Pulse: 97  Temp: 98 F (36.7 C)  TempSrc: Oral  SpO2: 99%  Weight: 131 lb 9.6 oz (59.7 kg)  Height: 5' 2.5" (1.588 m)   Results for orders placed or performed in visit on 03/24/18    POCT CBC  Result Value Ref Range   WBC 8.5 4.6 - 10.2 K/uL   Lymph, poc 2.9 0.6 - 3.4   POC LYMPH PERCENT 34.1 10 - 50 %L   MID (cbc) 0.4 0 - 0.9   POC MID % 4.3 0 - 12 %M   POC Granulocyte 5.2 2 - 6.9   Granulocyte percent 61.6 37 - 80 %G   RBC 5.06 4.04 - 5.48 M/uL   Hemoglobin 15.2 12.2 - 16.2 g/dL   HCT, POC 44.6 37.7 - 47.9 %   MCV 88.1 80 -  97 fL   MCH, POC 30.1 27 - 31.2 pg   MCHC 34.1 31.8 - 35.4 g/dL   RDW, POC 12.1 %   Platelet Count, POC 422 142 - 424 K/uL   MPV 6.2 0 - 99.8 fL       Assessment & Plan:   TYNIAH KASTENS is a 27 y.o. female Lymphadenopathy of head and neck - Plan: Epstein-Barr virus VCA antibody panel, POCT CBC, amoxicillin-clavulanate (AUGMENTIN) 875-125 MG tablet  Fatigue, unspecified type - Plan: Epstein-Barr virus VCA antibody panel, POCT CBC  Left otitis media, unspecified otitis media type - Plan: amoxicillin-clavulanate (AUGMENTIN) 875-125 MG tablet  With fatigue, lymphadenopathy, differential includes viral illness such as mono.  CBC reassuring, oropharynx appears normal.  Did have some left ear symptoms and possible early left otitis media.  I do not see any appreciable scalp rash or seborrhea to cause posterior lymphadenopathy.  -Check EBV titer, but may be early in illness for that to become positive.  Start Augmentin, symptomatic care and RTC precautions discussed  Meds ordered this encounter  Medications  . amoxicillin-clavulanate (AUGMENTIN) 875-125 MG tablet    Sig: Take 1 tablet by mouth 2 (two) times daily.    Dispense:  20 tablet    Refill:  0   Patient Instructions   Start Augmentin 1 pill twice per day.  Avoid any contact sports for now as we discussed as mono is still possible.  I will check a mono test but as we discussed sometimes that can be falsely negative early on in the course of illness.  If swollen lymph nodes at the back of your neck and side of your neck are not improving in the next 1 to 2 weeks, or any worsening  symptoms sooner, please return for recheck.  Thanks for coming in today.   Lymphadenopathy Lymphadenopathy refers to swollen or enlarged lymph glands, also called lymph nodes. Lymph glands are part of your body's defense (immune) system, which protects the body from infections, germs, and diseases. Lymph glands are found in many locations in your body, including the neck, underarm, and groin. Many things can cause lymph glands to become enlarged. When your immune system responds to germs, such as viruses or bacteria, infection-fighting cells and fluid build up. This causes the glands to grow in size. Usually, this is not something to worry about. The swelling and any soreness often go away without treatment. However, swollen lymph glands can also be caused by a number of diseases. Your health care provider may do various tests to help determine the cause. If the cause of your swollen lymph glands cannot be found, it is important to monitor your condition to make sure the swelling goes away. Follow these instructions at home: Watch your condition for any changes. The following actions may help to lessen any discomfort you are feeling:  Get plenty of rest.  Take medicines only as directed by your health care provider. Your health care provider may recommend over-the-counter medicines for pain.  Apply moist heat compresses to the site of swollen lymph nodes as directed by your health care provider. This can help reduce any pain.  Check your lymph nodes daily for any changes.  Keep all follow-up visits as directed by your health care provider. This is important.  Contact a health care provider if:  Your lymph nodes are still swollen after 2 weeks.  Your swelling increases or spreads to other areas.  Your lymph nodes are hard, seem fixed to the  skin, or are growing rapidly.  Your skin over the lymph nodes is red and inflamed.  You have a fever.  You have chills.  You have fatigue.  You  develop a sore throat.  You have abdominal pain.  You have weight loss.  You have night sweats. Get help right away if:  You notice fluid leaking from the area of the enlarged lymph node.  You have severe pain in any area of your body.  You have chest pain.  You have shortness of breath. This information is not intended to replace advice given to you by your health care provider. Make sure you discuss any questions you have with your health care provider. Document Released: 08/03/2008 Document Revised: 04/01/2016 Document Reviewed: 05/30/2014 Elsevier Interactive Patient Education  2018 [employer].   IF you received an x-ray today, you will receive an invoice from [facility]. Please contact [facility] at [phone] with questions or concerns regarding your invoice.   IF you received labwork today, you will receive an invoice from [place]. Please contact [facility] at [phone] with questions or concerns regarding your invoice.   Our billing staff will not be able to assist you with questions regarding bills from these companies.  You will be contacted with the lab results as soon as they are available. The fastest way to get your results is to activate your My Chart account. Instructions are located on the last page of this paperwork. If you have not heard from [place] regarding the results in 2 weeks, please contact this office.       I personally performed the services described in this documentation, which was scribed in my presence. The recorded information has been reviewed and considered for accuracy and completeness, addended by me as needed, and agree with information above.  Signed,   Merri Ray, MD [facility] [place].  03/25/18 3:16 PM

## 2018-03-25 LAB — EPSTEIN-BARR VIRUS VCA ANTIBODY PANEL
EBV NA IgG: <18 U/mL (ref 0.0–17.9)
EBV VCA IGG: 56 U/mL — AB (ref 0.0–17.9)
EBV VCA IgM: <36 U/mL (ref 0.0–35.9)

## 2018-07-24 ENCOUNTER — Inpatient Hospital Stay: Admission: RE | Admit: 2018-07-24 | Payer: BLUE CROSS/BLUE SHIELD | Source: Ambulatory Visit

## 2018-09-04 ENCOUNTER — Other Ambulatory Visit: Payer: BLUE CROSS/BLUE SHIELD

## 2018-09-04 ENCOUNTER — Other Ambulatory Visit: Payer: Self-pay

## 2018-09-04 ENCOUNTER — Other Ambulatory Visit (HOSPITAL_COMMUNITY)
Admission: RE | Admit: 2018-09-04 | Discharge: 2018-09-04 | Disposition: A | Discharge status: Home / Self Care | Payer: BLUE CROSS/BLUE SHIELD | Source: Ambulatory Visit | Attending: Obstetrics and Gynecology | Admitting: Obstetrics and Gynecology

## 2018-09-04 ENCOUNTER — Encounter: Payer: Self-pay | Admitting: Obstetrics and Gynecology

## 2018-09-04 ENCOUNTER — Ambulatory Visit: Payer: BLUE CROSS/BLUE SHIELD | Admitting: Obstetrics and Gynecology

## 2018-09-04 VITALS — BP 110/74 | HR 84 | Ht 63.0 in | Wt 129.8 lb

## 2018-09-04 DIAGNOSIS — Z124 Encounter for screening for malignant neoplasm of cervix: Secondary | ICD-10-CM | POA: Insufficient documentation

## 2018-09-04 DIAGNOSIS — Z113 Encounter for screening for infections with a predominantly sexual mode of transmission: Secondary | ICD-10-CM | POA: Diagnosis not present

## 2018-09-04 DIAGNOSIS — Z3044 Encounter for surveillance of vaginal ring hormonal contraceptive device: Secondary | ICD-10-CM | POA: Diagnosis not present

## 2018-09-04 DIAGNOSIS — Z01419 Encounter for gynecological examination (general) (routine) without abnormal findings: Secondary | ICD-10-CM | POA: Diagnosis not present

## 2018-09-04 DIAGNOSIS — Z0000 Encounter for general adult medical examination without abnormal findings: Secondary | ICD-10-CM | POA: Diagnosis not present

## 2018-09-04 MED ORDER — ETONOGESTREL-ETHINYL ESTRADIOL 0.12-0.015 MG/24HR VA RING: VAGINAL_RING | VAGINAL | 3 refills | Status: DC

## 2018-09-04 NOTE — Patient Instructions (Signed)
EXERCISE AND DIET:  We recommended that you start or continue a regular exercise program for good health. Regular exercise means any activity that makes your heart beat faster and makes you sweat.  We recommend exercising at least 30 minutes per day at least 3 days a week, preferably 4 or 5.  We also recommend a diet low in fat and sugar.  Inactivity, poor dietary choices and obesity can cause diabetes, heart attack, stroke, and kidney damage, among others.    ALCOHOL AND SMOKING:  Women should limit their alcohol intake to no more than 7 drinks/beers/glasses of wine (combined, not each!) per week. Moderation of alcohol intake to this level decreases your risk of breast cancer and liver damage. And of course, no recreational drugs are part of a healthy lifestyle.  And absolutely no smoking or even second hand smoke. Most people know smoking can cause heart and lung diseases, but did you know it also contributes to weakening of your bones? Aging of your skin?  Yellowing of your teeth and nails?  CALCIUM AND VITAMIN D:  Adequate intake of calcium and Vitamin D are recommended.  The recommendations for exact amounts of these supplements seem to change often, but generally speaking 1,000 mg of calcium (either carbonate or citrate) and 800 units of Vitamin D per day seems prudent. Certain women may benefit from higher intake of Vitamin D.  If you are among these women, your doctor will have told you during your visit.    PAP SMEARS:  Pap smears, to check for cervical cancer or precancers,  have traditionally been done yearly, although recent scientific advances have shown that most women can have pap smears less often.  However, every woman still should have a physical exam from her gynecologist every year. It will include a breast check, inspection of the vulva and vagina to check for abnormal growths or skin changes, a visual exam of the cervix, and then an exam to evaluate the size and shape of the uterus and  ovaries.  And after 28 years of age, a rectal exam is indicated to check for rectal cancers. We will also provide age appropriate advice regarding health maintenance, like when you should have certain vaccines, screening for sexually transmitted diseases, bone density testing, colonoscopy, mammograms, etc.   MAMMOGRAMS:  All women over 40 years old should have a yearly mammogram. Many facilities now offer a "3D" mammogram, which may cost around $50 extra out of pocket. If possible,  we recommend you accept the option to have the 3D mammogram performed.  It both reduces the number of women who will be called back for extra views which then turn out to be normal, and it is better than the routine mammogram at detecting truly abnormal areas.    COLONOSCOPY:  Colonoscopy to screen for colon cancer is recommended for all women at age 50.  We know, you hate the idea of the prep.  We agree, BUT, having colon cancer and not knowing it is worse!!  Colon cancer so often starts as a polyp that can be seen and removed at colonscopy, which can quite literally save your life!  And if your first colonoscopy is normal and you have no family history of colon cancer, most women don't have to have it again for 10 years.  Once every ten years, you can do something that may end up saving your life, right?  We will be happy to help you get it scheduled when you are ready.    Be sure to check your insurance coverage so you understand how much it will cost.  It may be covered as a preventative service at no cost, but you should check your particular policy.      Breast Self-Awareness Breast self-awareness means being familiar with how your breasts look and feel. It involves checking your breasts regularly and reporting any changes to your health care provider. Practicing breast self-awareness is important. A change in your breasts can be a sign of a serious medical problem. Being familiar with how your breasts look and feel allows  you to find any problems early, when treatment is more likely to be successful. All women should practice breast self-awareness, including women who have had breast implants. How to do a breast self-exam One way to learn what is normal for your breasts and whether your breasts are changing is to do a breast self-exam. To do a breast self-exam: Look for Changes  1. Remove all the clothing above your waist. 2. Stand in front of a mirror in a room with good lighting. 3. Put your hands on your hips. 4. Push your hands firmly downward. 5. Compare your breasts in the mirror. Look for differences between them (asymmetry), such as: ? Differences in shape. ? Differences in size. ? Puckers, dips, and bumps in one breast and not the other. 6. Look at each breast for changes in your skin, such as: ? Redness. ? Scaly areas. 7. Look for changes in your nipples, such as: ? Discharge. ? Bleeding. ? Dimpling. ? Redness. ? A change in position. Feel for Changes  Carefully feel your breasts for lumps and changes. It is best to do this while lying on your back on the floor and again while sitting or standing in the shower or tub with soapy water on your skin. Feel each breast in the following way:  Place the arm on the side of the breast you are examining above your head.  Feel your breast with the other hand.  Start in the nipple area and make  inch (2 cm) overlapping circles to feel your breast. Use the pads of your three middle fingers to do this. Apply light pressure, then medium pressure, then firm pressure. The light pressure will allow you to feel the tissue closest to the skin. The medium pressure will allow you to feel the tissue that is a little deeper. The firm pressure will allow you to feel the tissue close to the ribs.  Continue the overlapping circles, moving downward over the breast until you feel your ribs below your breast.  Move one finger-width toward the center of the body.  Continue to use the  inch (2 cm) overlapping circles to feel your breast as you move slowly up toward your collarbone.  Continue the up and down exam using all three pressures until you reach your armpit.  Write Down What You Find  Write down what is normal for each breast and any changes that you find. Keep a written record with breast changes or normal findings for each breast. By writing this information down, you do not need to depend only on memory for size, tenderness, or location. Write down where you are in your menstrual cycle, if you are still menstruating. If you are having trouble noticing differences in your breasts, do not get discouraged. With time you will become more familiar with the variations in your breasts and more comfortable with the exam. How often should I examine my breasts? Examine   your breasts every month. If you are breastfeeding, the best time to examine your breasts is after a feeding or after using a breast pump. If you menstruate, the best time to examine your breasts is 5-7 days after your period is over. During your period, your breasts are lumpier, and it may be more difficult to notice changes. When should I see my health care provider? See your health care provider if you notice:  A change in shape or size of your breasts or nipples.  A change in the skin of your breast or nipples, such as a reddened or scaly area.  Unusual discharge from your nipples.  A lump or thick area that was not there before.  Pain in your breasts.  Anything that concerns you.  This information is not intended to replace advice given to you by your health care provider. Make sure you discuss any questions you have with your health care provider. Document Released: 10/25/2005 Document Revised: 04/01/2016 Document Reviewed: 09/14/2015 Elsevier Interactive Patient Education  2018 [employer]  

## 2018-09-04 NOTE — Progress Notes (Signed)
27 y.o. G0P0000 Single White or Caucasian Not Hispanic or Latino female here for annual exam.   Period Cycle (Days): 84 Period Duration (Days): 3 days Period Pattern: Regular Menstrual Flow: Moderate Menstrual Control: Tampon Menstrual Control Change Freq (Hours): changes tampon every 6 hours Dysmenorrhea: None  Patient's last menstrual period was 07/25/2018 (approximate).          Sexually active: Yes.    The current method of family planning is Nuvaring.   Exercising: No.  The patient does not participate in regular exercise at present. Smoker:  no  Health Maintenance: Pap:  08-29-2017 atypical glandular cells on her pap with negative HPV testing, 08-05-16 WNL NEG HR HPV  History of abnormal Pap:  Yes, 09/19/2017 colpo some atypia suggestive of hpv, no clear dysplasia was seen. Her ECC was negative. Prior h/o CIN I in 2016, no treatment MMG:  10-24-2017 Birads 3 probably benign, follow up scheduled for 09-11-2018 Colonoscopy:  Never BMD:   Never TDaP:  2017 Gardasil: completed all 3    reports that she has quit smoking. She quit after 0.50 years of use. She has never used smokeless tobacco. She reports that she drinks about 1.0 - 2.0 standard drinks of alcohol per week. She reports that she does not use drugs. Works as a Emergency planning/management officer. Lives with her boyfriend.   History reviewed. No pertinent past medical history.  Past Surgical History:  Procedure Laterality Date  . TYMPANOSTOMY TUBE PLACEMENT      Current Outpatient Medications  Medication Sig Dispense Refill  . BIOTIN PO Take by mouth.    . etonogestrel-ethinyl estradiol (NUVARING) 0.12-0.015 MG/24HR vaginal ring UNWRAP AND INSERT 1 RING VAGINALLY FOR 3 WEEKS IN AND 1 WEEK OUT 3 each 3  . fluticasone (FLONASE) 50 MCG/ACT nasal spray Place 2 sprays into both nostrils at bedtime. 16 g 11   No current facility-administered medications for this visit.     Family History  Problem Relation Age of Onset  . Hyperlipidemia  Mother   . Hypertension Mother   . Prostate cancer Father     Review of Systems  Constitutional: Negative.   HENT: Negative.   Eyes: Negative.   Respiratory: Negative.   Cardiovascular: Negative.   Gastrointestinal: Negative.   Endocrine: Negative.   Genitourinary: Negative.   Musculoskeletal: Negative.   Skin: Negative.   Allergic/Immunologic: Negative.   Neurological: Negative.   Hematological: Negative.   Psychiatric/Behavioral: Negative.     Exam:   BP 110/74 (BP Location: Right Arm, Patient Position: Sitting, Cuff Size: Normal)   Pulse 84   Ht 5\' 3"  (1.6 m)   Wt 129 lb 12.8 oz (58.9 kg)   LMP 07/25/2018 (Approximate) Comment: Nuvaring  BMI 22.99 kg/m   Weight change: @WEIGHTCHANGE @ Height:   Height: 5\' 3"  (160 cm)  Ht Readings from Last 3 Encounters:  09/04/18 5\' 3"  (1.6 m)  03/24/18 5' 2.5" (1.588 m)  12/22/17 5' 2.5" (1.588 m)    General appearance: alert, cooperative and appears stated age Head: Normocephalic, without obvious abnormality, atraumatic Neck: no adenopathy, supple, symmetrical, trachea midline and thyroid normal to inspection and palpation Lungs: clear to auscultation bilaterally Cardiovascular: regular rate and rhythm Breasts: left breast with an ~1 cm, smooth mobile, bean shaped lump at 12 o'clock, ~1 cm from the areolar region (stable fibroadenoma). Right breast normal appearance, no masses or tenderness Abdomen: soft, non-tender; non distended,  no masses,  no organomegaly Extremities: extremities normal, atraumatic, no cyanosis or edema Skin: Skin color, texture,  turgor normal. No rashes or lesions Lymph nodes: Cervical, supraclavicular, and axillary nodes normal. No abnormal inguinal nodes palpated Neurologic: Grossly normal   Pelvic: External genitalia:  no lesions              Urethra:  normal appearing urethra with no masses, tenderness or lesions              Bartholins and Skenes: normal                 Vagina: normal appearing  vagina with normal color and discharge, no lesions              Cervix: no lesions and +ectropion, friable with pap               Bimanual Exam:  Uterus:  normal size, contour, position, consistency, mobility, non-tender              Adnexa: no mass, fullness, tenderness               Rectovaginal: Confirms               Anus:  normal sphincter tone, no lesions  Chaperone was present for exam.  A:  Well Woman with normal exam  H/O left fibroadneoma  H/O abnormal pap  P:   Pap with hpv  Breast ultrasound scheuduled  Screening labs  Testing for STD's  Discussed breast self exam  Discussed calcium and vit D intake

## 2018-09-05 LAB — HIV ANTIBODY (ROUTINE TESTING W REFLEX): HIV SCREEN 4TH GENERATION: NONREACTIVE

## 2018-09-05 LAB — CBC
HEMOGLOBIN: 13.4 g/dL (ref 11.1–15.9)
Hematocrit: 40.4 % (ref 34.0–46.6)
MCH: 29.8 pg (ref 26.6–33.0)
MCHC: 33.2 g/dL (ref 31.5–35.7)
MCV: 90 fL (ref 79–97)
Platelets: 378 10*3/uL (ref 150–450)
RBC: 4.5 x10E6/uL (ref 3.77–5.28)
RDW: 12.2 % — ABNORMAL LOW (ref 12.3–15.4)
WBC: 7 10*3/uL (ref 3.4–10.8)

## 2018-09-05 LAB — LIPID PANEL
CHOL/HDL RATIO: 2.9 ratio (ref 0.0–4.4)
Cholesterol, Total: 184 mg/dL (ref 100–199)
HDL: 64 mg/dL (ref >39)
LDL Calculated: 98 mg/dL (ref 0–99)
Triglycerides: 109 mg/dL (ref 0–149)
VLDL CHOLESTEROL CAL: 22 mg/dL (ref 5–40)

## 2018-09-05 LAB — COMPREHENSIVE METABOLIC PANEL
ALBUMIN: 4 g/dL (ref 3.5–5.5)
ALT: 14 IU/L (ref 0–32)
AST: 16 IU/L (ref 0–40)
Albumin/Globulin Ratio: 1.6 (ref 1.2–2.2)
Alkaline Phosphatase: 56 IU/L (ref 39–117)
BILIRUBIN TOTAL: 0.6 mg/dL (ref 0.0–1.2)
BUN/Creatinine Ratio: 12 (ref 9–23)
BUN: 9 mg/dL (ref 6–20)
CHLORIDE: 106 mmol/L (ref 96–106)
CO2: 22 mmol/L (ref 20–29)
Calcium: 9 mg/dL (ref 8.7–10.2)
Creatinine, Ser: 0.75 mg/dL (ref 0.57–1.00)
GFR, EST AFRICAN AMERICAN: 125 mL/min/{1.73_m2} (ref >59)
GFR, EST NON AFRICAN AMERICAN: 109 mL/min/{1.73_m2} (ref >59)
GLUCOSE: 76 mg/dL (ref 65–99)
Globulin, Total: 2.5 g/dL (ref 1.5–4.5)
Potassium: 4.2 mmol/L (ref 3.5–5.2)
Sodium: 142 mmol/L (ref 134–144)
TOTAL PROTEIN: 6.5 g/dL (ref 6.0–8.5)

## 2018-09-05 LAB — RPR: RPR Ser Ql: NONREACTIVE

## 2018-09-06 LAB — CYTOLOGY - PAP
CHLAMYDIA, DNA PROBE: NEGATIVE
DIAGNOSIS: NEGATIVE
HPV: NOT DETECTED
Neisseria Gonorrhea: NEGATIVE

## 2018-09-11 ENCOUNTER — Ambulatory Visit
Admission: RE | Admit: 2018-09-11 | Discharge: 2018-09-11 | Disposition: A | Discharge status: Home / Self Care | Payer: BLUE CROSS/BLUE SHIELD | Source: Ambulatory Visit | Attending: Obstetrics and Gynecology | Admitting: Obstetrics and Gynecology

## 2018-09-11 DIAGNOSIS — N6321 Unspecified lump in the left breast, upper outer quadrant: Secondary | ICD-10-CM | POA: Diagnosis not present

## 2018-09-11 DIAGNOSIS — N6322 Unspecified lump in the left breast, upper inner quadrant: Secondary | ICD-10-CM | POA: Diagnosis not present

## 2018-09-11 DIAGNOSIS — D242 Benign neoplasm of left breast: Secondary | ICD-10-CM

## 2019-08-21 ENCOUNTER — Other Ambulatory Visit: Payer: Self-pay | Admitting: *Deleted

## 2019-08-21 MED ORDER — ETONOGESTREL-ETHINYL ESTRADIOL 0.12-0.015 MG/24HR VA RING: VAGINAL_RING | VAGINAL | 0 refills | Status: DC

## 2019-08-21 NOTE — Telephone Encounter (Signed)
Medication refill request: Nuvaring Last AEX:  09/04/18 JJ Next AEX: none Last MMG (if hormonal medication request): none Refill authorized:   Called patient to schedule AEX. Patient states insurance doesn't cover our office. She has to switch providers.   Patient states she needs 1 month supply of her nuvaring to get her to her appt.  Please approve #[id number]. Routed to [employer]

## 2019-09-10 ENCOUNTER — Ambulatory Visit: Payer: BLUE CROSS/BLUE SHIELD | Admitting: Obstetrics and Gynecology

## 2019-12-13 ENCOUNTER — Other Ambulatory Visit: Payer: Self-pay

## 2019-12-13 DIAGNOSIS — Z01419 Encounter for gynecological examination (general) (routine) without abnormal findings: Secondary | ICD-10-CM

## 2019-12-13 NOTE — Telephone Encounter (Signed)
It looks like Dr Loura Back sent in 3 nuvarings with 3 refills at her annual exam in 11/20. Maybe she doesn't know there are refills? Can you please check with her or her pharmacy.

## 2019-12-13 NOTE — Telephone Encounter (Signed)
Spoke to [employer], Pt has 3  RF left from 09/2019 Rx. Pt called and made aware and will call our office when out of Rx.   Routing to Dr Talbert Nan for review and will close encounter. Current Rx refused.

## 2019-12-13 NOTE — Telephone Encounter (Signed)
Received fax from [facility] for refill.   Med refill request:  Nuva ring Last [id number] pt. Pt states saw Dr Redmond Pulling at [facility] for annual on 09/2019 Next AEX: 09/08/2020 with [place] (if hormonal med) n/a Refill authorized: # 3 packages, 4 RF to get to AEX, pended if approved.

## 2020-01-28 ENCOUNTER — Ambulatory Visit: Payer: 59 | Admitting: Registered Nurse

## 2020-01-28 ENCOUNTER — Encounter: Payer: Self-pay | Admitting: Registered Nurse

## 2020-01-28 ENCOUNTER — Ambulatory Visit (INDEPENDENT_AMBULATORY_CARE_PROVIDER_SITE_OTHER): Payer: 59

## 2020-01-28 ENCOUNTER — Other Ambulatory Visit: Payer: Self-pay

## 2020-01-28 VITALS — BP 146/99 | HR 94 | Temp 97.6°F | Ht 63.0 in | Wt 118.4 lb

## 2020-01-28 DIAGNOSIS — S6991XA Unspecified injury of right wrist, hand and finger(s), initial encounter: Secondary | ICD-10-CM

## 2020-01-28 DIAGNOSIS — M79641 Pain in right hand: Secondary | ICD-10-CM

## 2020-01-28 NOTE — Progress Notes (Signed)
Acute Office Visit  Subjective:    Patient ID: Anne Franco, female    DOB: [birth year], 29 y.o.   MRN: [id number]  Chief Complaint  Patient presents with  . Hand Pain    patient states she slipped yesterday and fell on her hand and felt like she just jammed her right pinky has been hurting since , and trying to bend and move it hurts. Per patient she has not took anything for pain and its not swollen at the time    HPI Patient is in today for hand pain Fell yesterday and jammed hand - digits 3-4-5. Pain, some swelling, limited ROM d/t pain No radiation up arm. No loss of sensation.  No hx of injury to this hand No break in skin integrity or local signs of infection  No past medical history on file.  Past Surgical History:  Procedure Laterality Date  . TYMPANOSTOMY TUBE PLACEMENT      Family History  Problem Relation Age of Onset  . Hyperlipidemia Mother   . Hypertension Mother   . Prostate cancer Father     Social History   Socioeconomic History  . Marital status: Single    Spouse name: Not on file  . Number of children: Not on file  . Years of education: Not on file  . Highest education level: Not on file  Occupational History  . Not on file  Tobacco Use  . Smoking status: Former Smoker    Years: 0.50  . Smokeless tobacco: Never Used  Substance and Sexual Activity  . Alcohol use: Yes    Alcohol/week: 1.0 - 2.0 standard drinks    Types: 1 - 2 Standard drinks or equivalent per week  . Drug use: No  . Sexual activity: Yes    Partners: Male    Birth control/protection: Inserts    Comment: Nuvaring  Other Topics Concern  . Not on file  Social History Narrative  . Not on file   Social Determinants of Health   Financial Resource Strain:   . Difficulty of Paying Living Expenses:   Food Insecurity:   . Worried About Charity fundraiser in the Last Year:   . Arboriculturist in the Last Year:   Transportation Needs:   . Film/video editor (Medical):    Marland Kitchen Lack of Transportation (Non-Medical):   Physical Activity:   . Days of Exercise per Week:   . Minutes of Exercise per Session:   Stress:   . Feeling of Stress :   Social Connections:   . Frequency of Communication with Friends and Family:   . Frequency of Social Gatherings with Friends and Family:   . Attends Religious Services:   . Active Member of Clubs or Organizations:   . Attends Archivist Meetings:   Marland Kitchen Marital Status:   Intimate Partner Violence:   . Fear of Current or Ex-Partner:   . Emotionally Abused:   Marland Kitchen Physically Abused:   . Sexually Abused:     Outpatient Medications Prior to Visit  Medication Sig Dispense Refill  . BIOTIN PO Take by mouth.    . etonogestrel-ethinyl estradiol (NUVARING) 0.12-0.015 MG/24HR vaginal ring UNWRAP AND INSERT 1 RING VAGINALLY FOR 3 WEEKS IN AND 1 WEEK OUT 1 each 0  . fluticasone (FLONASE) 50 MCG/ACT nasal spray Place 2 sprays into both nostrils at bedtime. 16 g 11   No facility-administered medications prior to visit.    No Known Allergies  Review  of Systems Per hpi      Objective:    Physical Exam Vitals and nursing note reviewed.  Constitutional:      General: She is not in acute distress.    Appearance: Normal appearance. She is normal weight. She is not ill-appearing or toxic-appearing.  Cardiovascular:     Rate and Rhythm: Normal rate and regular rhythm.  Pulmonary:     Effort: Pulmonary effort is normal. No respiratory distress.  Musculoskeletal:        General: Swelling, tenderness and signs of injury present. Normal range of motion.     Comments: rom normal with pain in right fifth digit Mild swelling, tenderness in that same digit  Skin:    Capillary Refill: Capillary refill takes less than 2 seconds.  Neurological:     General: No focal deficit present.     Mental Status: She is alert and oriented to person, place, and time. Mental status is at baseline.  Psychiatric:        Mood and Affect: Mood  normal.        Behavior: Behavior normal.        Thought Content: Thought content normal.        Judgment: Judgment normal.     BP (!) 146/99   Pulse 94   Temp 97.6 F (36.4 C) (Temporal)   Ht 5\' 3"  (1.6 m)   Wt 118 lb 6.4 oz (53.7 kg)   SpO2 100%   BMI 20.97 kg/m  Wt Readings from Last 3 Encounters:  01/28/20 118 lb 6.4 oz (53.7 kg)  09/04/18 129 lb 12.8 oz (58.9 kg)  03/24/18 131 lb 9.6 oz (59.7 kg)    There are no preventive care reminders to display for this patient.  There are no preventive care reminders to display for this patient.   No results found for: TSH Lab Results  Component Value Date   WBC 7.0 09/04/2018   HGB 13.4 09/04/2018   HCT 40.4 09/04/2018   MCV 90 09/04/2018   PLT 378 09/04/2018   Lab Results  Component Value Date   NA 142 09/04/2018   K 4.2 09/04/2018   CO2 22 09/04/2018   GLUCOSE 76 09/04/2018   BUN 9 09/04/2018   CREATININE 0.75 09/04/2018   BILITOT 0.6 09/04/2018   ALKPHOS 56 09/04/2018   AST 16 09/04/2018   ALT 14 09/04/2018   PROT 6.5 09/04/2018   ALBUMIN 4.0 09/04/2018   CALCIUM 9.0 09/04/2018   Lab Results  Component Value Date   CHOL 184 09/04/2018   Lab Results  Component Value Date   HDL 64 09/04/2018   Lab Results  Component Value Date   LDLCALC 98 09/04/2018   Lab Results  Component Value Date   TRIG 109 09/04/2018   Lab Results  Component Value Date   CHOLHDL 2.9 09/04/2018   No results found for: HGBA1C     Assessment & Plan:   Problem List Items Addressed This Visit    None    Visit Diagnoses    Injury of right hand, initial encounter    -  Primary   Relevant Orders   DG Hand Complete Right (Completed)       No orders of the defined types were placed in this encounter.  PLAN  Xray of r hand reassuring. Likely sprain of finger  Buddy tape, RICE method  Return if ROM does not improve or ongoing pain/limitations  Patient encouraged to call clinic with any questions, comments,  or  concerns.   Maximiano Coss, NP

## 2020-01-28 NOTE — Patient Instructions (Signed)
° ° ° °  If you have lab work done today you will be contacted with your lab results within the next 2 weeks.  If you have not heard from us then please contact us. The fastest way to get your results is to register for My Chart. ° ° °IF you received an x-ray today, you will receive an invoice from [facility]. Please contact [facility] at [phone] with questions or concerns regarding your invoice.  ° °IF you received labwork today, you will receive an invoice from [facility]. Please contact [facility] at [phone] with questions or concerns regarding your invoice.  ° °Our billing staff will not be able to assist you with questions regarding bills from these companies. ° °You will be contacted with the lab results as soon as they are available. The fastest way to get your results is to activate your My Chart account. Instructions are located on the last page of this paperwork. If you have not heard from us regarding the results in 2 weeks, please contact this office. °  ° ° ° °

## 2020-03-10 ENCOUNTER — Ambulatory Visit: Payer: 59 | Admitting: Registered Nurse

## 2020-03-10 ENCOUNTER — Encounter: Payer: Self-pay | Admitting: Registered Nurse

## 2020-03-10 ENCOUNTER — Other Ambulatory Visit: Payer: Self-pay

## 2020-03-10 VITALS — BP 113/83 | HR 100 | Temp 98.1°F | Resp 16 | Ht 63.0 in | Wt 122.0 lb

## 2020-03-10 DIAGNOSIS — J302 Other seasonal allergic rhinitis: Secondary | ICD-10-CM

## 2020-03-10 DIAGNOSIS — H44001 Unspecified purulent endophthalmitis, right eye: Secondary | ICD-10-CM | POA: Diagnosis not present

## 2020-03-10 MED ORDER — ERYTHROMYCIN 5 MG/GM OP OINT: 1.0000 "application " | TOPICAL_OINTMENT | Freq: Three times a day (TID) | OPHTHALMIC | 2 refills | Status: DC

## 2020-03-10 MED ORDER — AZELASTINE HCL 0.05 % OP SOLN: 1.0000 [drp] | Freq: Two times a day (BID) | OPHTHALMIC | 12 refills | Status: DC

## 2020-03-10 NOTE — Patient Instructions (Signed)
° ° ° °  If you have lab work done today you will be contacted with your lab results within the next 2 weeks.  If you have not heard from us then please contact us. The fastest way to get your results is to register for My Chart. ° ° °IF you received an x-ray today, you will receive an invoice from [facility]. Please contact [facility] at [phone] with questions or concerns regarding your invoice.  ° °IF you received labwork today, you will receive an invoice from [facility]. Please contact [facility] at [phone] with questions or concerns regarding your invoice.  ° °Our billing staff will not be able to assist you with questions regarding bills from these companies. ° °You will be contacted with the lab results as soon as they are available. The fastest way to get your results is to activate your My Chart account. Instructions are located on the last page of this paperwork. If you have not heard from us regarding the results in 2 weeks, please contact this office. °  ° ° ° °

## 2020-03-11 ENCOUNTER — Encounter: Payer: Self-pay | Admitting: Registered Nurse

## 2020-03-11 ENCOUNTER — Ambulatory Visit: Payer: 59 | Admitting: Registered Nurse

## 2020-03-11 NOTE — Progress Notes (Signed)
Acute Office Visit  Subjective:    Patient ID: Anne Franco, female    DOB: 01/31/1991, 29 y.o.   MRN: [id number]  Chief Complaint  Patient presents with  . Eye Pain    patient states she scratched her eye on satuday it was just tender to touch and today when she woke up her eye was swollen , crusty , and hard to open. Per patient she took some eye drops yesterday and her vision is still ok , and not a problem with sunlight    HPI Patient is in today for pain and swelling in R eye  States that on Saturday night, scratched her eye when it was itching, woke up Sunday with some redness and tenderness, today woke up with it crusted shut. Redness mostly at medial canthus but some injection into the R eye. No pain in eye itself, no pressure, no acute visual changes. No swelling, mass, or sign of hordeolum. No left eye involvement.  No past medical history on file.  Past Surgical History:  Procedure Laterality Date  . TYMPANOSTOMY TUBE PLACEMENT      Family History  Problem Relation Age of Onset  . Hyperlipidemia Mother   . Hypertension Mother   . Prostate cancer Father     Social History   Socioeconomic History  . Marital status: Single    Spouse name: Not on file  . Number of children: Not on file  . Years of education: Not on file  . Highest education level: Not on file  Occupational History  . Not on file  Tobacco Use  . Smoking status: Former Smoker    Years: 0.50  . Smokeless tobacco: Never Used  Substance and Sexual Activity  . Alcohol use: Yes    Alcohol/week: 1.0 - 2.0 standard drinks    Types: 1 - 2 Standard drinks or equivalent per week  . Drug use: No  . Sexual activity: Yes    Partners: Male    Birth control/protection: Inserts    Comment: Nuvaring  Other Topics Concern  . Not on file  Social History Narrative  . Not on file   Social Determinants of Health   Financial Resource Strain:   . Difficulty of Paying Living Expenses:   Food  Insecurity:   . Worried About Charity fundraiser in the Last Year:   . Arboriculturist in the Last Year:   Transportation Needs:   . Film/video editor (Medical):   Marland Kitchen Lack of Transportation (Non-Medical):   Physical Activity:   . Days of Exercise per Week:   . Minutes of Exercise per Session:   Stress:   . Feeling of Stress :   Social Connections:   . Frequency of Communication with Friends and Family:   . Frequency of Social Gatherings with Friends and Family:   . Attends Religious Services:   . Active Member of Clubs or Organizations:   . Attends Archivist Meetings:   Marland Kitchen Marital Status:   Intimate Partner Violence:   . Fear of Current or Ex-Partner:   . Emotionally Abused:   Marland Kitchen Physically Abused:   . Sexually Abused:     Outpatient Medications Prior to Visit  Medication Sig Dispense Refill  . BIOTIN PO Take by mouth.    . etonogestrel-ethinyl estradiol (NUVARING) 0.12-0.015 MG/24HR vaginal ring UNWRAP AND INSERT 1 RING VAGINALLY FOR 3 WEEKS IN AND 1 WEEK OUT 1 each 0  . fluticasone (FLONASE) 50 MCG/ACT  nasal spray Place 2 sprays into both nostrils at bedtime. 16 g 11   No facility-administered medications prior to visit.    No Known Allergies  Review of Systems Per hpi otherwise negative on 10pt ros    Objective:    Physical Exam Vitals and nursing note reviewed.  Constitutional:      Appearance: Normal appearance. She is normal weight.  Eyes:     General: Lids are normal. Lids are everted, no foreign bodies appreciated. Vision grossly intact. Gaze aligned appropriately. No allergic shiner, visual field deficit or scleral icterus.       Right eye: Discharge present. No foreign body or hordeolum.        Left eye: No foreign body, discharge or hordeolum.     Extraocular Movements: Extraocular movements intact.     Right eye: Normal extraocular motion and no nystagmus.     Left eye: Normal extraocular motion and no nystagmus.     Conjunctiva/sclera:      Right eye: Right conjunctiva is injected. No chemosis, exudate or hemorrhage.    Left eye: Left conjunctiva is not injected. No chemosis, exudate or hemorrhage.    Pupils: Pupils are equal, round, and reactive to light.  Cardiovascular:     Rate and Rhythm: Normal rate and regular rhythm.  Pulmonary:     Effort: Pulmonary effort is normal. No respiratory distress.  Neurological:     General: No focal deficit present.     Mental Status: She is alert and oriented to person, place, and time. Mental status is at baseline.  Psychiatric:        Mood and Affect: Mood normal.        Behavior: Behavior normal.        Thought Content: Thought content normal.        Judgment: Judgment normal.     BP 113/83   Pulse 100   Temp 98.1 F (36.7 C) (Temporal)   Resp 16   Ht 5\' 3"  (1.6 m)   Wt 122 lb (55.3 kg)   SpO2 100%   BMI 21.61 kg/m  Wt Readings from Last 3 Encounters:  03/10/20 122 lb (55.3 kg)  01/28/20 118 lb 6.4 oz (53.7 kg)  09/04/18 129 lb 12.8 oz (58.9 kg)    There are no preventive care reminders to display for this patient.  There are no preventive care reminders to display for this patient.   No results found for: TSH Lab Results  Component Value Date   WBC 7.0 09/04/2018   HGB 13.4 09/04/2018   HCT 40.4 09/04/2018   MCV 90 09/04/2018   PLT 378 09/04/2018   Lab Results  Component Value Date   NA 142 09/04/2018   K 4.2 09/04/2018   CO2 22 09/04/2018   GLUCOSE 76 09/04/2018   BUN 9 09/04/2018   CREATININE 0.75 09/04/2018   BILITOT 0.6 09/04/2018   ALKPHOS 56 09/04/2018   AST 16 09/04/2018   ALT 14 09/04/2018   PROT 6.5 09/04/2018   ALBUMIN 4.0 09/04/2018   CALCIUM 9.0 09/04/2018   Lab Results  Component Value Date   CHOL 184 09/04/2018   Lab Results  Component Value Date   HDL 64 09/04/2018   Lab Results  Component Value Date   LDLCALC 98 09/04/2018   Lab Results  Component Value Date   TRIG 109 09/04/2018   Lab Results  Component Value  Date   CHOLHDL 2.9 09/04/2018   No results found for: HGBA1C  Assessment & Plan:   Problem List Items Addressed This Visit      Other   Seasonal allergies   Relevant Medications   azelastine (OPTIVAR) 0.05 % ophthalmic solution    Other Visit Diagnoses    Infection of right eye    -  Primary   Relevant Medications   erythromycin ophthalmic ointment       Meds ordered this encounter  Medications  . erythromycin ophthalmic ointment    Sig: Place 1 application into the right eye 3 (three) times daily.    Dispense:  7 g    Refill:  2    Order Specific Question:   Supervising Provider    Answer:   Delia Chimes A [id number]  . azelastine (OPTIVAR) 0.05 % ophthalmic solution    Sig: Place 1 drop into both eyes 2 (two) times daily.    Dispense:  6 mL    Refill:  12    Order Specific Question:   Supervising Provider    Answer:   Forrest Moron [id number]   PLAN  Mild infection from scratch  Erythromycin ointment thin ribbon applied tid  Warm compresses  Azelastine 0.05% ophth 1 drop twice daily for allergy relief  Return and ED precautions given  nonpharm given  Patient encouraged to call clinic with any questions, comments, or concerns.  Maximiano Coss, NP

## 2020-03-17 IMAGING — US ULTRASOUND LEFT BREAST LIMITED
1 series · 4 of 4 positions shown · non-contrast
Comparison: Prior studies including 10/24/2017

CLINICAL DATA: Two year follow-up for probably benign mass in the
LEFT breast.

EXAM:
ULTRASOUND OF THE LEFT BREAST

[Series 1: ultrasound left breast limited · 0.06mm/px · 4 of 4 slices shown]
[im 1/4]
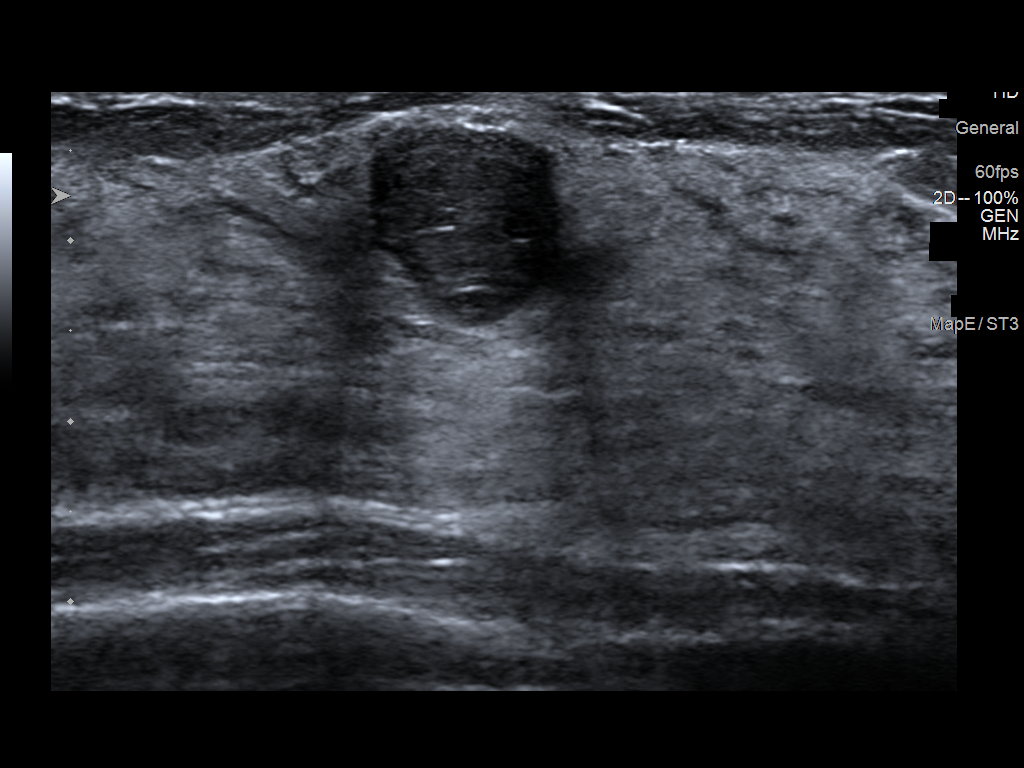
[im 2/4]
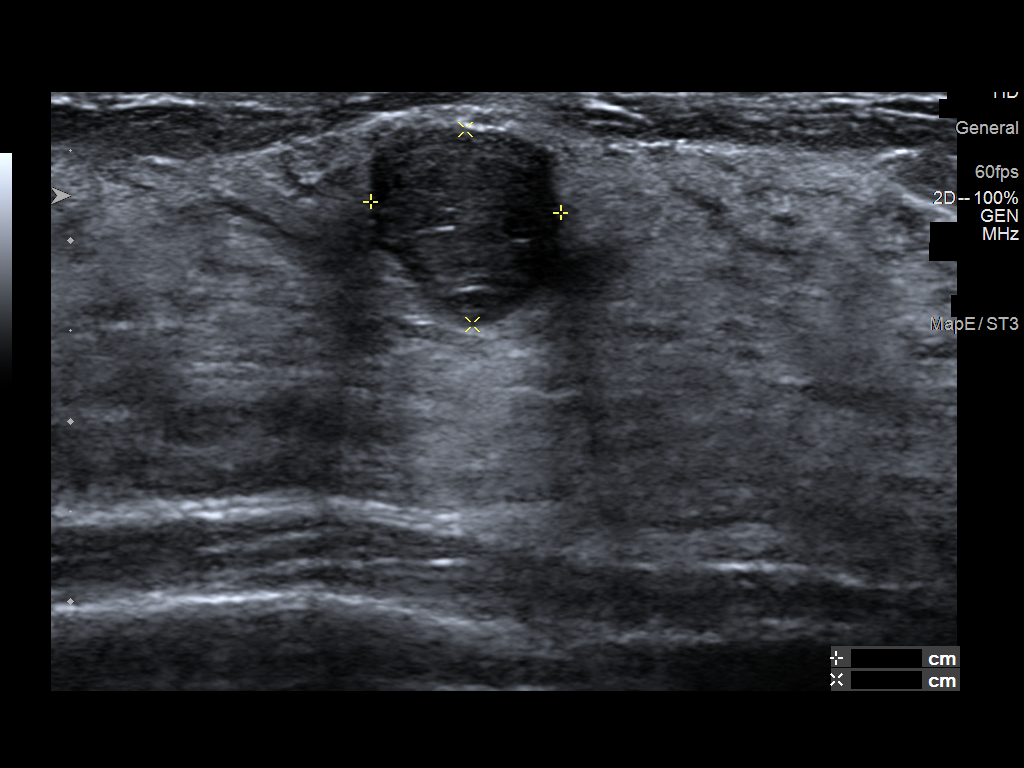
[im 3/4]
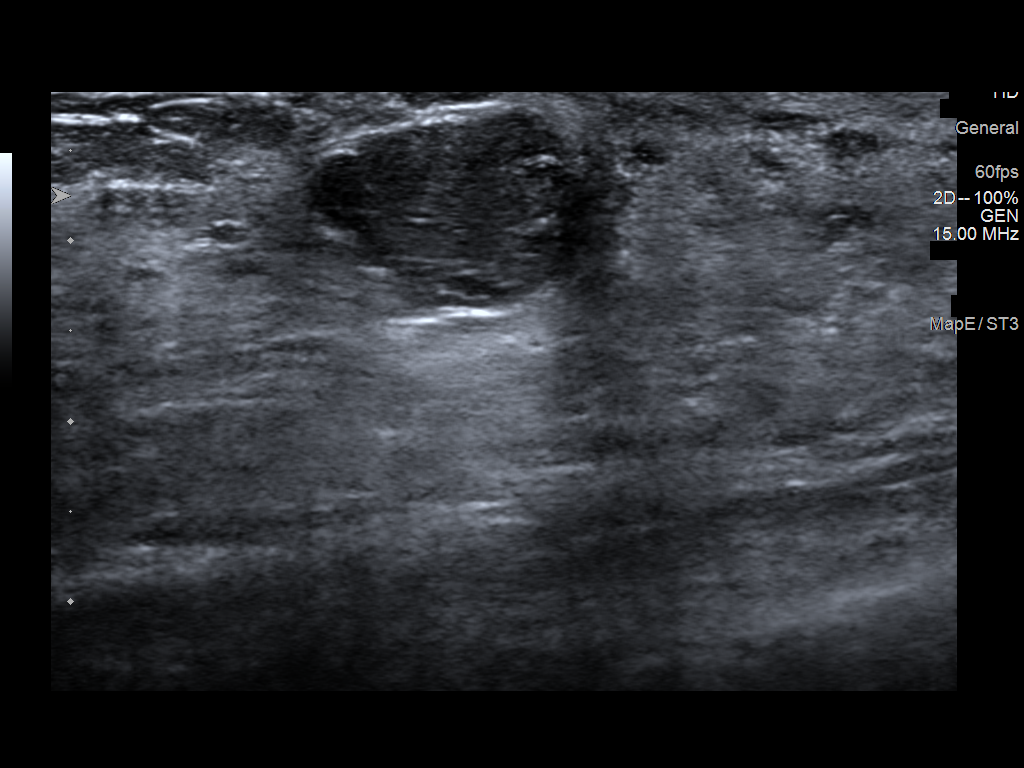
[im 4/4]
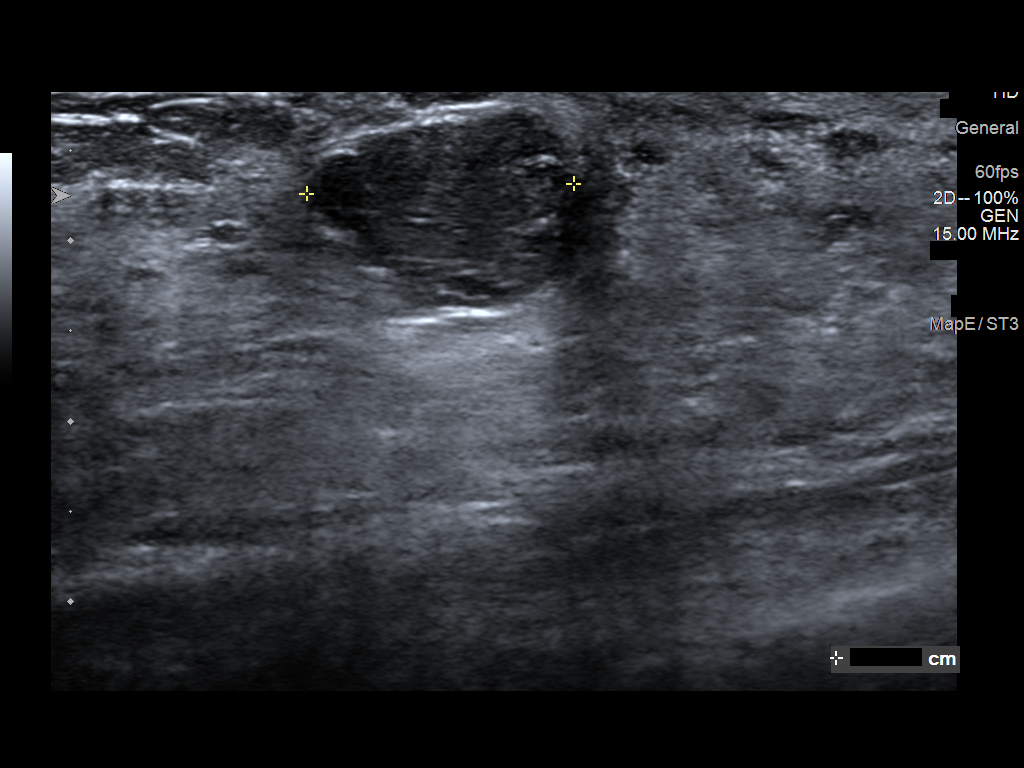

[4 of 4 positions shown; findings below may reference images not displayed]

FINDINGS: Targeted ultrasound is performed, showing a circumscribed oval
hypoechoic parallel mass in the 12 o'clock location of the LEFT
breast 1 centimeter from the nipple. Mass measures 1.1 x 1.1 x
centimeters. No acoustic shadowing or suspicious features. On the
first exam mass measured 1.1 x 1.0 x 1.6 centimeters.
IMPRESSION: Stable lesion in the LEFT breast is consistent with a benign
fibroadenoma.

RECOMMENDATION:
Screening mammogram at age 39 unless there are persistent or
intervening clinical concerns. (Code:E9-B-0Z3)

I have discussed the findings and recommendations with the patient.
Results were also provided in writing at the conclusion of the
visit. If applicable, a reminder letter will be sent to the patient
regarding the next appointment.

BI-RADS CATEGORY  2: Benign.

## 2020-07-25 ENCOUNTER — Ambulatory Visit: Payer: Self-pay

## 2020-07-25 ENCOUNTER — Other Ambulatory Visit: Payer: Self-pay

## 2020-07-25 ENCOUNTER — Ambulatory Visit (INDEPENDENT_AMBULATORY_CARE_PROVIDER_SITE_OTHER): Payer: 59 | Admitting: Family Medicine

## 2020-07-25 ENCOUNTER — Ambulatory Visit (INDEPENDENT_AMBULATORY_CARE_PROVIDER_SITE_OTHER): Payer: 59

## 2020-07-25 ENCOUNTER — Encounter: Payer: Self-pay | Admitting: Family Medicine

## 2020-07-25 VITALS — BP 110/80 | HR 85 | Ht 63.0 in | Wt 119.6 lb

## 2020-07-25 DIAGNOSIS — M79674 Pain in right toe(s): Secondary | ICD-10-CM | POA: Diagnosis not present

## 2020-07-25 NOTE — Patient Instructions (Signed)
.  Thank you for coming in today.  Please get an Xray today before you leave Please get labs today before you leave  Please use voltaren gel up to 4x daily for pain as needed.   Use the metatarsal pads or the dancer pad from [email or web address]  Could also make metatarsal float by cutting a hole in a old insole.   Also consider turf toe insoles.   Get a Steel Turf Toe insole.  Do a Producer, television/film/video for [employer]     If not better one of the next step is an injection into the toe.    Look up Hallux Rigidus as a medical problem.

## 2020-07-25 NOTE — Progress Notes (Signed)
Subjective:    CC: R foot pain  HPI: Pt is a 29 y/o female presenting w/ c/o R great toe pain x 2 months w/ no known MOI .  She locates her pain to her R 1st MTP joint.  Pain is present   Radiating pain: No R foot swelling: No Aggravating factors: Running; wearing high heels; R great toe F/E Treatments tried: Tylenol prn  Pertinent review of Systems: No fevers or chills  Relevant historical information: Family history rheumatoid arthritis in mother   Objective:    Vitals:   07/25/20 0929  BP: 110/80  Pulse: 85  SpO2: 99%   General: Well Developed, well nourished, and in no acute distress.   MSK: Right great toe normal-appearing without significant swelling erythema or deformity. Reduced range of motion dorsiflexion normal plantar flexion. Some pain with dorsiflexion. Mildly tender palpation plantar MTP. Pulses cap refill and sensation are intact distally.  Lab and Radiology Results X-ray images right great toe obtained today personally and independently interpreted Normal-appearing no significant erosions or significant DJD visible Await formal radiology review  Diagnostic Limited MSK Ultrasound of: Right great toe MTP Mild effusion present dorsal and medial joint. Trace hyperechoic change present plantar aspect of joint deep to flexor tendon.  Normal-appearing sesamoids.  Normal flexor tendon appearance Impression: Mild effusion in calcific change plantar aspect toe    Impression and Recommendations:    Assessment and Plan: 29 y.o. female with right great toe pain associated with some hallux rigidus.  Somewhat unclear etiology.  Patient is 29 years old and should not have this much dysfunction and pain in her great toe.  Plan for rheumatologic work-up especially given her mother's history of rheumatoid arthritis.  We will treat pragmatically with trial of Voltaren gel and dancers pads or metatarsal pads and shoe.  Also reviewed how to do a metatarsal  float. Also recommend possible of turf toe insole  If not moving recheck back in 4 to 6 weeks.  Will consider injection or custom orthotics.    Orders Placed This Encounter  Procedures   [place] LIMITED JOINT SPACE STRUCTURES LOW RIGHT(NO LINKED CHARGES)    Order Specific Question:   Reason for Exam (SYMPTOM  OR DIAGNOSIS REQUIRED)    Answer:   R great toe pain    Order Specific Question:   Preferred imaging location?    Answer:   [facility]   DG Toe Great Right    Standing Status:   Future    Number of Occurrences:   1    Standing Expiration Date:   07/25/2021    Order Specific Question:   Reason for Exam (SYMPTOM  OR DIAGNOSIS REQUIRED)    Answer:   great toe pain    Order Specific Question:   Is patient pregnant?    Answer:   No    Order Specific Question:   Preferred imaging location?    Answer:   [facility]    Order Specific Question:   Radiology Contrast Protocol - do NOT remove file path    Answer:   [email or web address]   Comprehensive metabolic panel    Standing Status:   Future    Number of Occurrences:   1    Standing Expiration Date:   07/25/2021   Rheumatoid factor    Standing Status:   Future    Number of Occurrences:   1    Standing Expiration Date:   07/25/2021   Sedimentation rate  Standing Status:   Future    Number of Occurrences:   1    Standing Expiration Date:   5/85/9292   Cyclic citrul peptide antibody, IgG    Standing Status:   Future    Number of Occurrences:   1    Standing Expiration Date:   07/25/2021   ANA    Standing Status:   Future    Number of Occurrences:   1    Standing Expiration Date:   07/25/2021   Uric acid    Standing Status:   Future    Number of Occurrences:   1    Standing Expiration Date:   07/25/2021   CK    Standing Status:   Future    Number of Occurrences:   1    Standing Expiration Date:   07/25/2021   No orders of the defined  types were placed in this encounter.   Discussed warning signs or symptoms. Please see discharge instructions. Patient expresses understanding.   The above documentation has been reviewed and is accurate and complete Lynne Leader, M.D.

## 2020-07-26 LAB — COMPREHENSIVE METABOLIC PANEL
AG Ratio: 1.7 (calc) (ref 1.0–2.5)
ALT: 18 U/L (ref 6–29)
AST: 18 U/L (ref 10–30)
Albumin: 4.3 g/dL (ref 3.6–5.1)
Alkaline phosphatase (APISO): 47 U/L (ref 31–125)
BUN: 8 mg/dL (ref 7–25)
CO2: 25 mmol/L (ref 20–32)
Calcium: 9.6 mg/dL (ref 8.6–10.2)
Chloride: 103 mmol/L (ref 98–110)
Creat: 0.76 mg/dL (ref 0.50–1.10)
Globulin: 2.6 g/dL (calc) (ref 1.9–3.7)
Glucose, Bld: 85 mg/dL (ref 65–99)
Potassium: 4.1 mmol/L (ref 3.5–5.3)
Sodium: 140 mmol/L (ref 135–146)
Total Bilirubin: 1.2 mg/dL (ref 0.2–1.2)
Total Protein: 6.9 g/dL (ref 6.1–8.1)

## 2020-07-28 NOTE — Progress Notes (Signed)
X-ray right great toe shows mild arthritis per radiology.

## 2020-07-29 LAB — CK: Total CK: 84 U/L (ref 29–143)

## 2020-07-29 LAB — SEDIMENTATION RATE: Sed Rate: 2 mm/h (ref 0–20)

## 2020-07-29 LAB — RHEUMATOID FACTOR: Rhuematoid fact SerPl-aCnc: <14 IU/mL (ref <14)

## 2020-07-29 LAB — ANA: Anti Nuclear Antibody (ANA): NEGATIVE

## 2020-07-29 LAB — CYCLIC CITRUL PEPTIDE ANTIBODY, IGG: Cyclic Citrullin Peptide Ab: <16 UNITS

## 2020-07-29 LAB — URIC ACID: Uric Acid, Serum: 4.2 mg/dL (ref 2.5–7.0)

## 2020-07-30 ENCOUNTER — Encounter: Payer: Self-pay | Admitting: Family Medicine

## 2020-07-30 NOTE — Progress Notes (Signed)
Rheumatology labs look normal. You do have arthritis on the x-ray and I think it is likely that you have injured that toe at some point in the past and that is why you are having an early arthritis.

## 2020-08-25 ENCOUNTER — Ambulatory Visit: Payer: Self-pay

## 2020-08-25 ENCOUNTER — Encounter: Payer: Self-pay | Admitting: Family Medicine

## 2020-08-25 ENCOUNTER — Other Ambulatory Visit: Payer: Self-pay

## 2020-08-25 ENCOUNTER — Ambulatory Visit (INDEPENDENT_AMBULATORY_CARE_PROVIDER_SITE_OTHER): Payer: 59 | Admitting: Family Medicine

## 2020-08-25 VITALS — BP 108/84 | HR 97 | Ht 63.0 in | Wt 120.8 lb

## 2020-08-25 DIAGNOSIS — M79674 Pain in right toe(s): Secondary | ICD-10-CM | POA: Diagnosis not present

## 2020-08-25 NOTE — Patient Instructions (Signed)
Thank you for coming in today.  Call or go to the ER if you develop a large red swollen joint with extreme pain or oozing puss.   Return as needed.   Get the turf toe insoles.

## 2020-08-25 NOTE — Progress Notes (Signed)
I, Wendy Poet, LAT, ATC, am serving as scribe for Dr. Lynne Leader.  Anne Franco is a 29 y.o. female who presents to [place] at [facility] today for f/u of R great toe pain.  She was last seen by Dr. Georgina Snell on 07/25/20 and was advised to use MT pads and a turf toe insole.  She was also advised to use Voltaren gel.  Since her last visit, pt reports that her R great toe has been hurting more.  She states that she has been wearing closed-toe shoes more and doesn't know if that is irritating it.  She tried the MT pads which did help the R great toe some but then made the rest of her foot hurt.  Diagnostic testing: R great toe XR- 07/25/20   Pertinent review of systems: No fevers or chills  Relevant historical information: History of cervical dysplasia.   Exam:  BP 108/84 (BP Location: Left Arm, Patient Position: Sitting, Cuff Size: Normal)   Pulse 97   Ht 5\' 3"  (1.6 m)   Wt 120 lb 12.8 oz (54.8 kg)   SpO2 99%   BMI 21.40 kg/m  General: Well Developed, well nourished, and in no acute distress.   MSK: Right toe prominent MTP.  Mildly tender palpation.  Decreased motion.    Lab and Radiology Results  Procedure: Real-time Ultrasound Guided Injection of right first MCP. Device: Philips Affiniti 50G Images permanently stored and available for review in PACS Moderate effusion seen at MTP prior to injection. Verbal informed consent obtained.  Discussed risks and benefits of procedure. Warned about infection bleeding damage to structures skin hypopigmentation and fat atrophy among others. Patient expresses understanding and agreement Time-out conducted.   Noted no overlying erythema, induration, or other signs of local infection.   Skin prepped in a sterile fashion.   Local anesthesia: Topical Ethyl chloride.   With sterile technique and under real time ultrasound guidance:  40 mg of Depo-Medrol and 0.5 mL of Marcaine injected into first MTP. Fluid seen entering  the joint capsule.   Completed without difficulty   Pain immediately resolved suggesting accurate placement of the medication.   Advised to call if fevers/chills, erythema, induration, drainage, or persistent bleeding.   Images permanently stored and available for review in the ultrasound unit.  Impression: Technically successful ultrasound guided injection.    EXAM: RIGHT GREAT TOE  COMPARISON:  None.  FINDINGS: Frontal, oblique, and lateral views of the right great toe are performed. No fracture, subluxation, or dislocation. There is mild joint space narrowing of the first metatarsophalangeal joint. Soft tissues are unremarkable.  IMPRESSION: 1. 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   Electronically Signed   By: Randa Ngo M.D.   On: 07/25/2020 21:31  I, Lynne Leader, personally (independently) visualized and performed the interpretation of the images attached in this note.  Lab Results  Component Value Date   LABURIC 4.2 07/25/2020        Assessment and Plan: 29 y.o. female with right great toe pain.  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  Failing conservative management.  Plan for injection as above and turf toe insole.  If not improving patient will notify me.  That point reasonable to refer to podiatry for orthotics or potentially with surgical planning.   PDMP not reviewed this encounter. Orders Placed This Encounter  Procedures  . [place] LIMITED JOINT SPACE STRUCTURES LOW RIGHT(NO LINKED CHARGES)    Order Specific  Question:   Reason for Exam (SYMPTOM  OR DIAGNOSIS REQUIRED)    Answer:   inj    Order Specific Question:   Preferred imaging location?    Answer:   [facility]   No orders of the defined types were placed in this encounter.    Discussed warning signs or symptoms. Please see discharge instructions. Patient expresses understanding.   The above documentation has been  reviewed and is accurate and complete Lynne Leader, M.D.

## 2020-09-04 NOTE — Progress Notes (Signed)
29 y.o. G0P0000 Single White or Caucasian Not Hispanic or Latino female here for annual exam.  She uses the nuvaring continuously, give herself a cycle every 2-3 months. No dyspareunia.  Period Pattern: (!) Irregular Menstrual Flow: Light Menstrual Control: Tampon Menstrual Control Change Freq (Hours): 5-6 Dysmenorrhea: (!) Mild Dysmenorrhea Symptoms: Cramping  Patient's last menstrual period was 08/26/2020 (approximate).          Sexually active: Yes.    The current method of family planning is NuvaRing vaginal inserts.    Exercising: Yes.    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  BMD:   None  Colonoscopy: none  TDaP:  2017  Gardasil: complete    reports that she has quit smoking. She quit after 0.50 years of use. She has never used smokeless tobacco. She reports current alcohol use of about 1.0 - 2.0 standard drink of alcohol per week. She reports that she does not use drugs. Works as a Emergency planning/management officer. Lives with her boyfriend.   History reviewed. No pertinent past medical history.  Past Surgical History:  Procedure Laterality Date  . TYMPANOSTOMY TUBE PLACEMENT      Current Outpatient Medications  Medication Sig Dispense Refill  . cetirizine (ZYRTEC) 10 MG tablet Take by mouth.    . etonogestrel-ethinyl estradiol (NUVARING) 0.12-0.015 MG/24HR vaginal ring UNWRAP AND INSERT 1 RING VAGINALLY FOR 3 WEEKS IN AND 1 WEEK OUT 1 each 0  . fluticasone (FLONASE) 50 MCG/ACT nasal spray Place 2 sprays into both nostrils at bedtime. 16 g 11   No current facility-administered medications for this visit.    Family History  Problem Relation Age of Onset  . Hyperlipidemia Mother   . Hypertension Mother    . Prostate cancer Father     Review of Systems  All other systems reviewed and are negative.   Exam:   BP 122/68   Pulse (!) 110   Ht 5' 2.25" (1.581 m)   Wt 123 lb (55.8 kg)   LMP 08/26/2020 (Approximate)   SpO2 100%   BMI 22.32 kg/m   Weight change: @WEIGHTCHANGE @ Height:   Height: 5' 2.25" (158.1 cm)  Ht Readings from Last 3 Encounters:  09/08/20 5' 2.25" (1.581 m)  08/25/20 5\' 3"  (1.6 m)  07/25/20 5\' 3"  (1.6 m)    General appearance: alert, cooperative and appears stated age Head: Normocephalic, without obvious abnormality, atraumatic Neck: no adenopathy, supple, symmetrical, trachea midline and thyroid normal to inspection and palpation Lungs: clear to auscultation bilaterally Cardiovascular: regular rate and rhythm Breasts: normal appearance, no masses or tenderness Abdomen: soft, non-tender; non distended,  no masses,  no organomegaly Extremities: extremities normal, atraumatic, no cyanosis or edema Skin: Skin color, texture, turgor normal. No rashes or lesions Lymph nodes: Cervical, supraclavicular, and axillary nodes normal. No abnormal inguinal nodes palpated Neurologic: Grossly normal   Pelvic: External genitalia:  no lesions              Urethra:  normal appearing urethra with no masses, tenderness or lesions              Bartholins and Skenes: normal                 Vagina:  normal appearing vagina with normal color and discharge, no lesions              Cervix: no lesions               Bimanual Exam:  Uterus:  normal size, contour, position, consistency, mobility, non-tender and anteverted              Adnexa: no mass, fullness, tenderness               Rectovaginal: Confirms               Anus:  normal sphincter tone, no lesions  Shanon Petty chaperoned for the exam.  A:  Well Woman with normal exam  Nuvaring doing well  P:   Pap with hpv  Labs UTD  Continue with the nuvaring  Discussed breast self exam  Discussed calcium and vit D intake  No  pregnancy plans in the next year

## 2020-09-08 ENCOUNTER — Encounter: Payer: Self-pay | Admitting: Obstetrics and Gynecology

## 2020-09-08 ENCOUNTER — Other Ambulatory Visit (HOSPITAL_COMMUNITY)
Admission: RE | Admit: 2020-09-08 | Discharge: 2020-09-08 | Disposition: A | Discharge status: Home / Self Care | Payer: 59 | Source: Ambulatory Visit | Attending: Obstetrics and Gynecology | Admitting: Obstetrics and Gynecology

## 2020-09-08 ENCOUNTER — Other Ambulatory Visit: Payer: Self-pay

## 2020-09-08 ENCOUNTER — Ambulatory Visit: Payer: 59 | Admitting: Obstetrics and Gynecology

## 2020-09-08 VITALS — BP 122/68 | HR 110 | Ht 62.25 in | Wt 123.0 lb

## 2020-09-08 DIAGNOSIS — Z3044 Encounter for surveillance of vaginal ring hormonal contraceptive device: Secondary | ICD-10-CM

## 2020-09-08 DIAGNOSIS — D242 Benign neoplasm of left breast: Secondary | ICD-10-CM | POA: Diagnosis not present

## 2020-09-08 DIAGNOSIS — Z124 Encounter for screening for malignant neoplasm of cervix: Secondary | ICD-10-CM

## 2020-09-08 DIAGNOSIS — Z01419 Encounter for gynecological examination (general) (routine) without abnormal findings: Secondary | ICD-10-CM | POA: Diagnosis not present

## 2020-09-08 MED ORDER — ETONOGESTREL-ETHINYL ESTRADIOL 0.12-0.015 MG/24HR VA RING: VAGINAL_RING | VAGINAL | 3 refills | Status: DC

## 2020-09-08 NOTE — Patient Instructions (Addendum)
EXERCISE AND DIET:  We recommended that you start or continue a regular exercise program for good health. Regular exercise means any activity that makes your heart beat faster and makes you sweat.  We recommend exercising at least 30 minutes per day at least 3 days a week, preferably 4 or 5.  We also recommend a diet low in fat and sugar.  Inactivity, poor dietary choices and obesity can cause diabetes, heart attack, stroke, and kidney damage, among others.    ALCOHOL AND SMOKING:  Women should limit their alcohol intake to no more than 7 drinks/beers/glasses of wine (combined, not each!) per week. Moderation of alcohol intake to this level decreases your risk of breast cancer and liver damage. And of course, no recreational drugs are part of a healthy lifestyle.  And absolutely no smoking or even second hand smoke. Most people know smoking can cause heart and lung diseases, but did you know it also contributes to weakening of your bones? Aging of your skin?  Yellowing of your teeth and nails?  CALCIUM AND VITAMIN D:  Adequate intake of calcium and Vitamin D are recommended.  The recommendations for exact amounts of these supplements seem to change often, but generally speaking 1,000 mg of calcium (between diet and supplement) and 800 units of Vitamin D per day seems prudent. Certain women may benefit from higher intake of Vitamin D.  If you are among these women, your doctor will have told you during your visit.    PAP SMEARS:  Pap smears, to check for cervical cancer or precancers,  have traditionally been done yearly, although recent scientific advances have shown that most women can have pap smears less often.  However, every woman still should have a physical exam from her gynecologist every year. It will include a breast check, inspection of the vulva and vagina to check for abnormal growths or skin changes, a visual exam of the cervix, and then an exam to evaluate the size and shape of the uterus and  ovaries.  And after 30 years of age, a rectal exam is indicated to check for rectal cancers. We will also provide age appropriate advice regarding health maintenance, like when you should have certain vaccines, screening for sexually transmitted diseases, bone density testing, colonoscopy, mammograms, etc.   MAMMOGRAMS:  All women over 40 years old should have a yearly mammogram. Many facilities now offer a "3D" mammogram, which may cost around $50 extra out of pocket. If possible,  we recommend you accept the option to have the 3D mammogram performed.  It both reduces the number of women who will be called back for extra views which then turn out to be normal, and it is better than the routine mammogram at detecting truly abnormal areas.    COLON CANCER SCREENING: Now recommend starting at age 45. At this time colonoscopy is not covered for routine screening until 50. There are take home tests that can be done between 45-49.   COLONOSCOPY:  Colonoscopy to screen for colon cancer is recommended for all women at age 50.  We know, you hate the idea of the prep.  We agree, BUT, having colon cancer and not knowing it is worse!!  Colon cancer so often starts as a polyp that can be seen and removed at colonscopy, which can quite literally save your life!  And if your first colonoscopy is normal and you have no family history of colon cancer, most women don't have to have it again for   10 years.  Once every ten years, you can do something that may end up saving your life, right?  We will be happy to help you get it scheduled when you are ready.  Be sure to check your insurance coverage so you understand how much it will cost.  It may be covered as a preventative service at no cost, but you should check your particular policy.      Breast Self-Awareness Breast self-awareness means being familiar with how your breasts look and feel. It involves checking your breasts regularly and reporting any changes to your  health care provider. Practicing breast self-awareness is important. A change in your breasts can be a sign of a serious medical problem. Being familiar with how your breasts look and feel allows you to find any problems early, when treatment is more likely to be successful. All women should practice breast self-awareness, including women who have had breast implants. How to do a breast self-exam One way to learn what is normal for your breasts and whether your breasts are changing is to do a breast self-exam. To do a breast self-exam: Look for Changes  1. Remove all the clothing above your waist. 2. Stand in front of a mirror in a room with good lighting. 3. Put your hands on your hips. 4. Push your hands firmly downward. 5. Compare your breasts in the mirror. Look for differences between them (asymmetry), such as: ? Differences in shape. ? Differences in size. ? Puckers, dips, and bumps in one breast and not the other. 6. Look at each breast for changes in your skin, such as: ? Redness. ? Scaly areas. 7. Look for changes in your nipples, such as: ? Discharge. ? Bleeding. ? Dimpling. ? Redness. ? A change in position. Feel for Changes Carefully feel your breasts for lumps and changes. It is best to do this while lying on your back on the floor and again while sitting or standing in the shower or tub with soapy water on your skin. Feel each breast in the following way:  Place the arm on the side of the breast you are examining above your head.  Feel your breast with the other hand.  Start in the nipple area and make  inch (2 cm) overlapping circles to feel your breast. Use the pads of your three middle fingers to do this. Apply light pressure, then medium pressure, then firm pressure. The light pressure will allow you to feel the tissue closest to the skin. The medium pressure will allow you to feel the tissue that is a little deeper. The firm pressure will allow you to feel the tissue  close to the ribs.  Continue the overlapping circles, moving downward over the breast until you feel your ribs below your breast.  Move one finger-width toward the center of the body. Continue to use the  inch (2 cm) overlapping circles to feel your breast as you move slowly up toward your collarbone.  Continue the up and down exam using all three pressures until you reach your armpit.  Write Down What You Find  Write down what is normal for each breast and any changes that you find. Keep a written record with breast changes or normal findings for each breast. By writing this information down, you do not need to depend only on memory for size, tenderness, or location. Write down where you are in your menstrual cycle, if you are still menstruating. If you are having trouble noticing differences   in your breasts, do not get discouraged. With time you will become more familiar with the variations in your breasts and more comfortable with the exam. How often should I examine my breasts? Examine your breasts every month. If you are breastfeeding, the best time to examine your breasts is after a feeding or after using a breast pump. If you menstruate, the best time to examine your breasts is 5-7 days after your period is over. During your period, your breasts are lumpier, and it may be more difficult to notice changes. When should I see my health care provider? See your health care provider if you notice:  A change in shape or size of your breasts or nipples.  A change in the skin of your breast or nipples, such as a reddened or scaly area.  Unusual discharge from your nipples.  A lump or thick area that was not there before.  Pain in your breasts.  Anything that concerns you.  

## 2020-09-09 LAB — CYTOLOGY - PAP
Comment: NEGATIVE
Diagnosis: NEGATIVE
High risk HPV: NEGATIVE

## 2020-11-03 ENCOUNTER — Telehealth: Payer: 59 | Admitting: Family Medicine

## 2020-11-03 ENCOUNTER — Other Ambulatory Visit: Payer: Self-pay

## 2021-03-04 ENCOUNTER — Ambulatory Visit: Payer: 59 | Admitting: Obstetrics and Gynecology

## 2021-03-04 ENCOUNTER — Encounter: Payer: Self-pay | Admitting: Obstetrics and Gynecology

## 2021-03-04 ENCOUNTER — Other Ambulatory Visit: Payer: Self-pay

## 2021-03-04 VITALS — BP 126/72 | HR 86 | Ht 62.0 in | Wt 128.0 lb

## 2021-03-04 DIAGNOSIS — L700 Acne vulgaris: Secondary | ICD-10-CM

## 2021-03-04 DIAGNOSIS — R140 Abdominal distension (gaseous): Secondary | ICD-10-CM | POA: Diagnosis not present

## 2021-03-04 DIAGNOSIS — Z3009 Encounter for other general counseling and advice on contraception: Secondary | ICD-10-CM | POA: Diagnosis not present

## 2021-03-04 MED ORDER — DROSPIRENONE-ETHINYL ESTRADIOL 3-0.02 MG PO TABS: 1.0000 | ORAL_TABLET | Freq: Every day | ORAL | 2 refills | Status: DC

## 2021-03-04 NOTE — Progress Notes (Signed)
GYNECOLOGY  VISIT   HPI: 30 y.o.   Single White or Caucasian Not Hispanic or Latino  female   G0P0000 with Patient's last menstrual period was 12/21/2020.   here for acne on chin and bloating. She is using the nuvaring continuously. Gives herself a cycle every 2-3 months.  She is sexually active   The acne has been worse in the last year. Mainly on her chin. She is seeing a Paediatric nurse and an Engineering geologist. The Dermatologist tried antibiotics, but it was only a short term fix. She has an appointment to see a new Dermatologist.  She also c/o bloating for the last 6 months. Feels fine when she wakes up, gets bloating with eating or drinking. Some foods are worse than others, but even water can make her feel bloated. Feels like she is pregnant. Not having more gas.  BM are one x a day, normal.    GYNECOLOGIC HISTORY: Patient's last menstrual period was 12/21/2020. Contraception: Nuvaring  Menopausal hormone therapy: none         OB History    Gravida  0   Para  0   Term  0   Preterm  0   AB  0   Living  0     SAB  0   IAB  0   Ectopic  0   Multiple  0   Live Births                 Patient Active Problem List   Diagnosis Date Noted  . Breast fibroadenoma in female, left 09/08/2020  . Cellulitis of second toe of left foot 04/18/2017  . Acute pharyngitis 01/17/2017  . Acute upper respiratory infection 01/17/2017  . Nasal congestion 01/17/2017  . Lack of concentration 01/17/2017  . Dysplasia of cervix, low grade (CIN 1) 08/19/2015  . Cough 07/21/2015  . Sinusitis 07/21/2015  . Seasonal allergies 07/21/2015  . Contraception 12/18/2011  . Recurrent otitis media 12/18/2011    No past medical history on file.  Past Surgical History:  Procedure Laterality Date  . TYMPANOSTOMY TUBE PLACEMENT      Current Outpatient Medications  Medication Sig Dispense Refill  . cetirizine (ZYRTEC) 10 MG tablet Take by mouth.    . etonogestrel-ethinyl estradiol (NUVARING)  0.12-0.015 MG/24HR vaginal ring UNWRAP AND INSERT 1 RING VAGINALLY FOR 3 WEEKS IN AND 1 WEEK OUT 3 each 3  . fluticasone (FLONASE) 50 MCG/ACT nasal spray Place 2 sprays into both nostrils at bedtime. 16 g 11   No current facility-administered medications for this visit.     ALLERGIES: Patient has no known allergies.  Family History  Problem Relation Age of Onset  . Hyperlipidemia Mother   . Hypertension Mother   . Prostate cancer Father     Social History   Socioeconomic History  . Marital status: Single    Spouse name: Not on file  . Number of children: Not on file  . Years of education: Not on file  . Highest education level: Not on file  Occupational History  . Not on file  Tobacco Use  . Smoking status: Former Smoker    Years: 0.50  . Smokeless tobacco: Never Used  Vaping Use  . Vaping Use: Never used  Substance and Sexual Activity  . Alcohol use: Yes    Alcohol/week: 1.0 - 2.0 standard drink    Types: 1 - 2 Standard drinks or equivalent per week  . Drug use: No  . Sexual activity: Yes  Partners: Male    Birth control/protection: Inserts    Comment: Nuvaring  Other Topics Concern  . Not on file  Social History Narrative  . Not on file   Social Determinants of Health   Financial Resource Strain: Not on file  Food Insecurity: Not on file  Transportation Needs: Not on file  Physical Activity: Not on file  Stress: Not on file  Social Connections: Not on file  Intimate Partner Violence: Not on file    Review of Systems  Gastrointestinal:       Bloating  Skin:       Acne     PHYSICAL EXAMINATION:    BP 126/72   Pulse 86   Ht 5\' 2"  (1.575 m)   Wt 128 lb (58.1 kg)   LMP 12/21/2020 Comment: nuvaring  SpO2 100%   BMI 23.41 kg/m     General appearance: alert, cooperative and appears stated age Abdomen: soft, non-tender; mildly distended, no masses,  no organomegaly  1. Acne vulgaris Recommended changing her from the nuvaring to Yaz. Discussed  the possibly increased risk of blood clots on the Yaz. - drospirenone-ethinyl estradiol (YAZ) 3-0.02 MG tablet; Take 1 tablet by mouth daily. Take continuously, skip the placebo pills  Dispense: 112 tablet; Refill: 2  2. Encounter for other general counseling or advice on contraception Change to Yaz - drospirenone-ethinyl estradiol (YAZ) 3-0.02 MG tablet; Take 1 tablet by mouth daily. Take continuously, skip the placebo pills  Dispense: 112 tablet; Refill: 2  3. Bloating Discussed multiple causes of bloating Given a patient handout from [employer] on bloating, including a list of foods that can contribute to bloating Can try Gas X If symptoms don't improve, recommend f/u with primary or GI  Over 20 minutes in total patient care.

## 2021-08-03 IMAGING — DX DG HAND COMPLETE 3+V*R*
3 series · 3 of 3 positions shown · non-contrast
Comparison: None.

CLINICAL DATA: Pain

EXAM:
RIGHT HAND - COMPLETE 3+ VIEW

[hand pa]
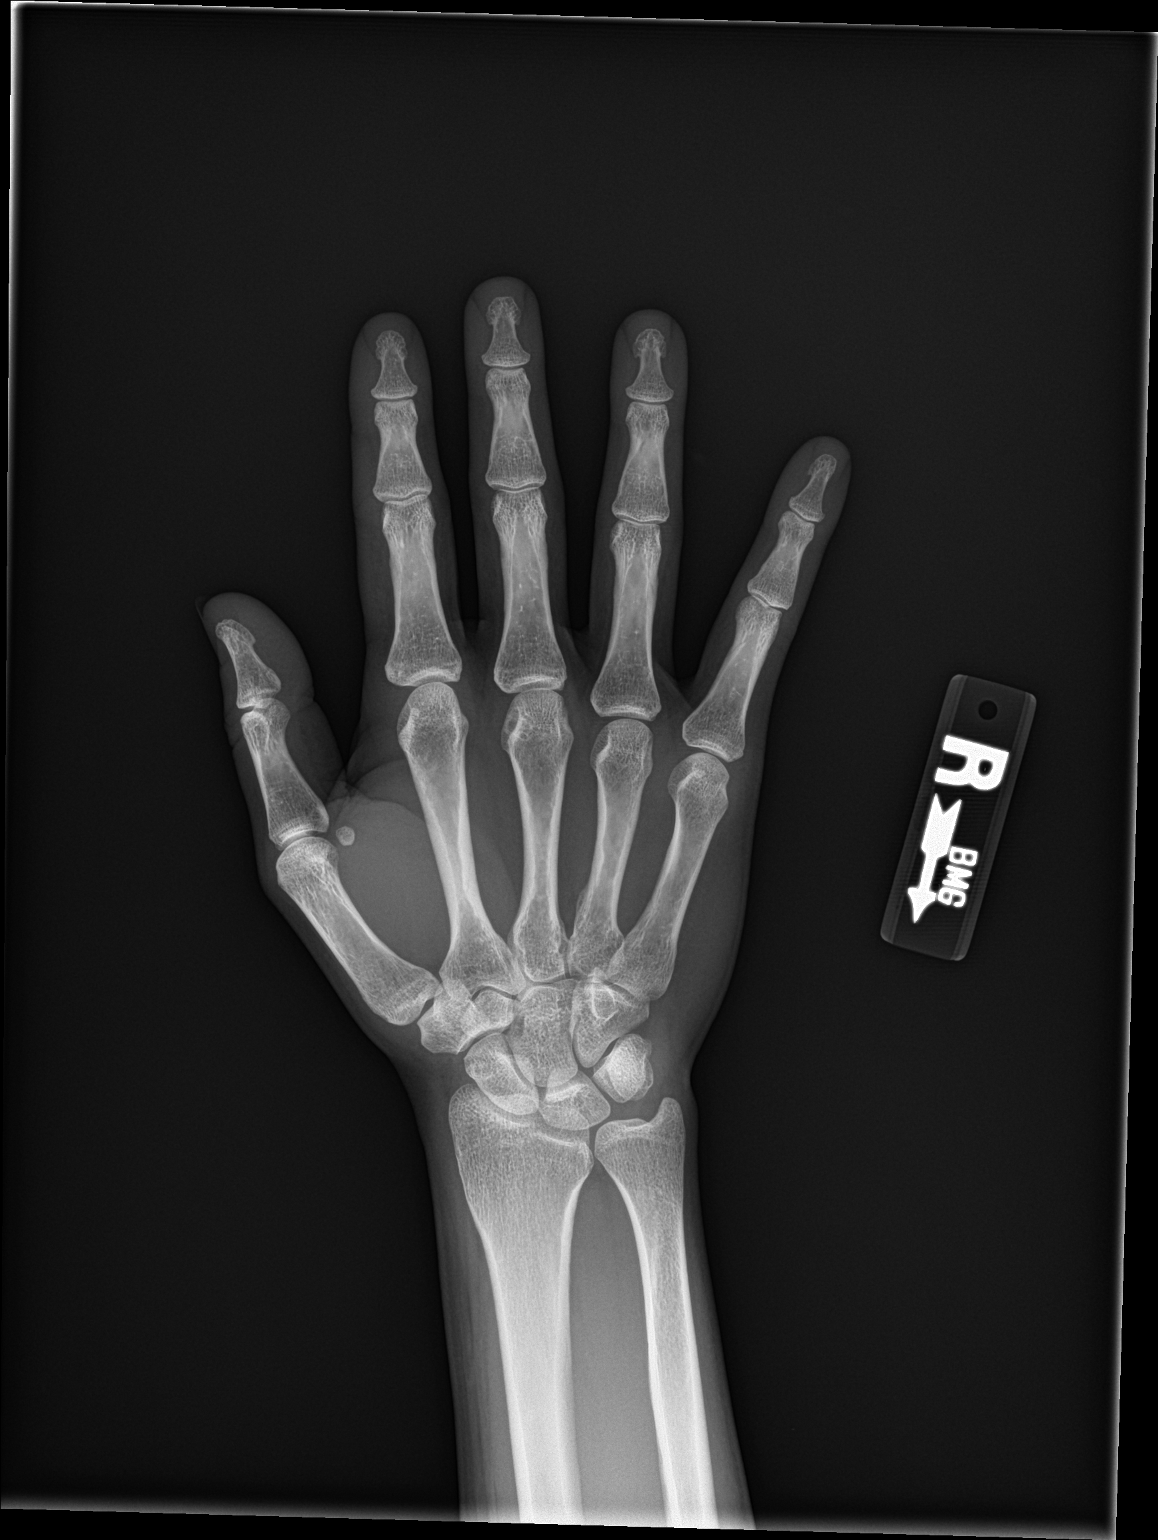

[hand obl]
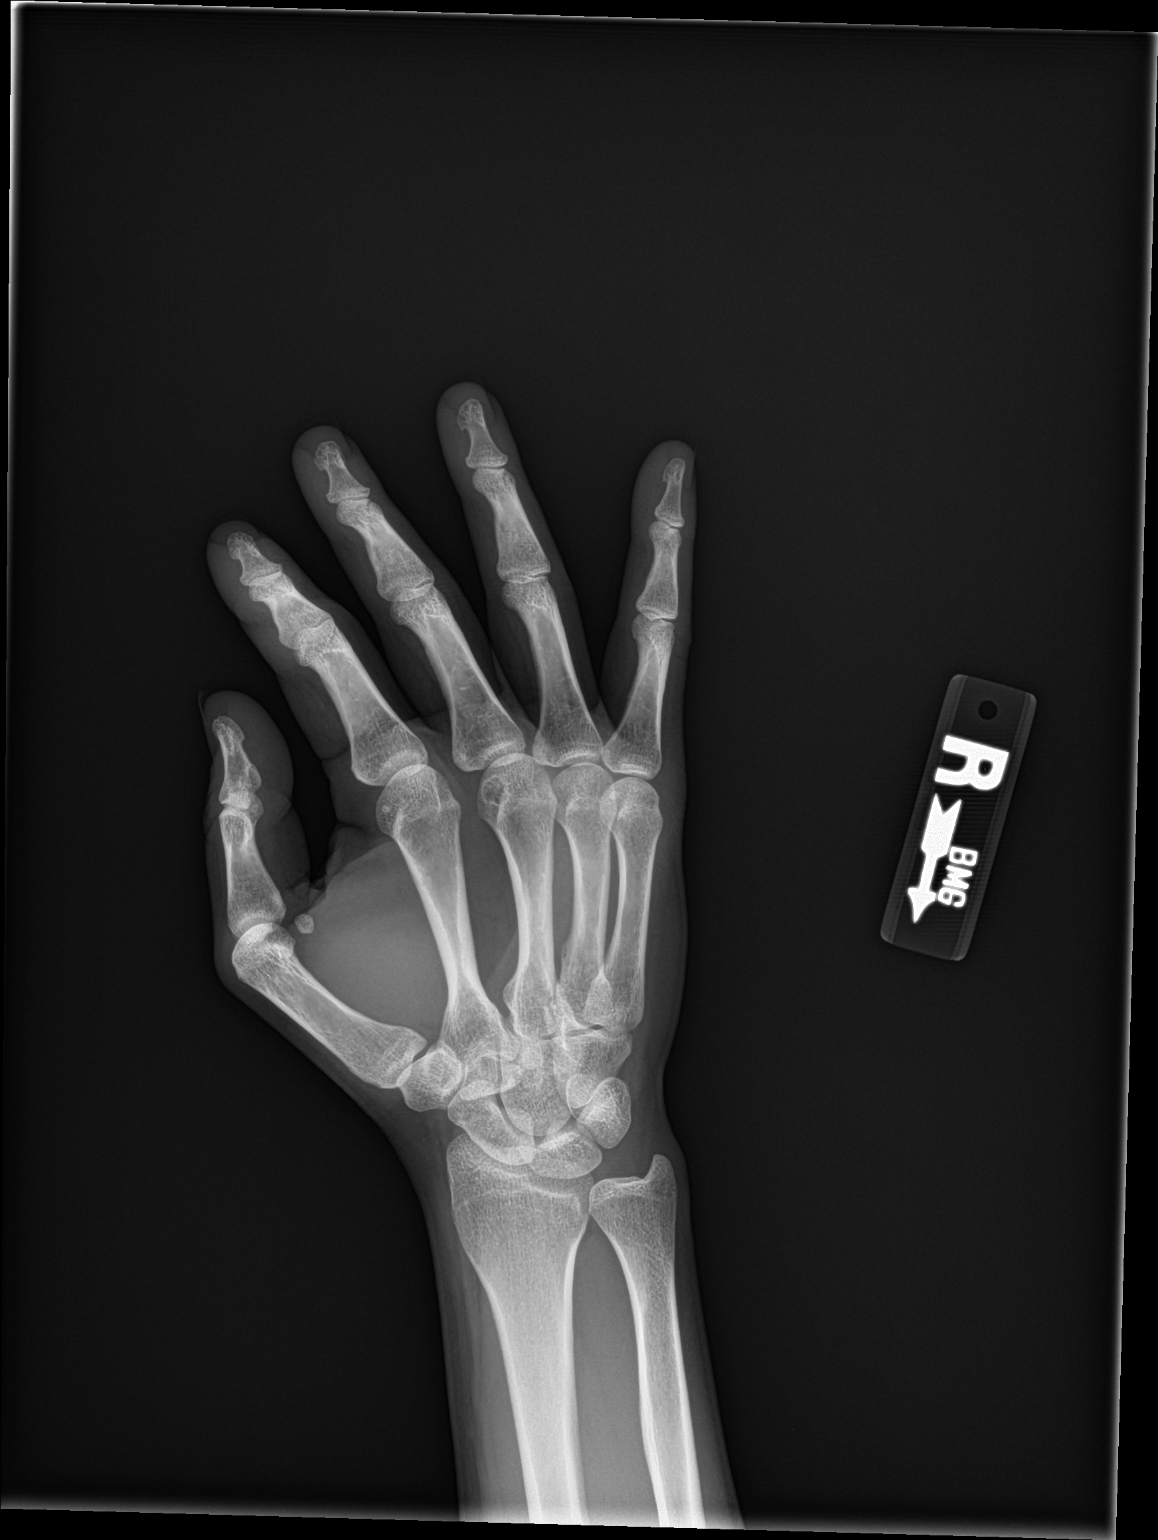

[hand lat]
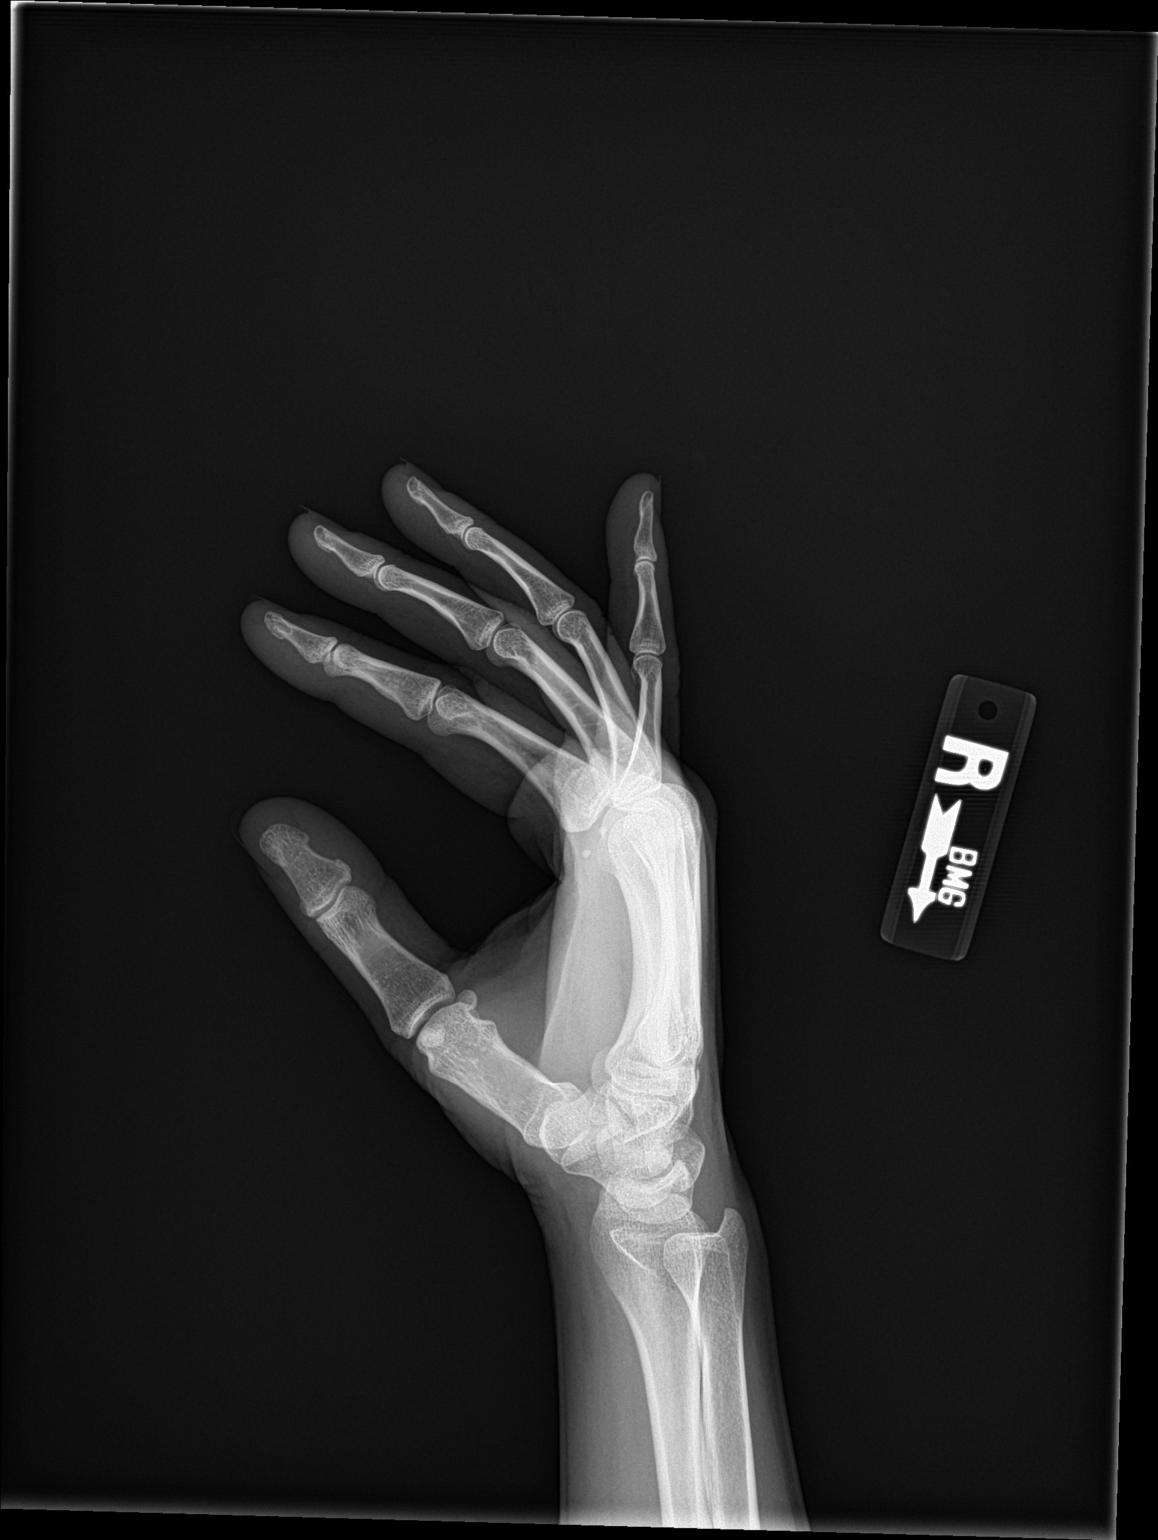

[3 of 3 positions shown; findings below may reference images not displayed]

FINDINGS: Frontal, oblique, and lateral views were obtained. No fracture or
dislocation. Joint spaces appear normal. No erosive change.
IMPRESSION: No fracture or dislocation.  No evident arthropathy.

## 2021-09-11 DIAGNOSIS — M2580 Other specified joint disorders, unspecified joint: Secondary | ICD-10-CM | POA: Insufficient documentation

## 2021-09-14 ENCOUNTER — Encounter: Payer: Self-pay | Admitting: Obstetrics and Gynecology

## 2021-09-14 ENCOUNTER — Ambulatory Visit (INDEPENDENT_AMBULATORY_CARE_PROVIDER_SITE_OTHER): Payer: 59 | Admitting: Obstetrics and Gynecology

## 2021-09-14 ENCOUNTER — Other Ambulatory Visit: Payer: Self-pay

## 2021-09-14 VITALS — BP 122/68 | HR 100 | Ht 62.5 in | Wt 123.0 lb

## 2021-09-14 DIAGNOSIS — Z3041 Encounter for surveillance of contraceptive pills: Secondary | ICD-10-CM

## 2021-09-14 DIAGNOSIS — Z01419 Encounter for gynecological examination (general) (routine) without abnormal findings: Secondary | ICD-10-CM | POA: Diagnosis not present

## 2021-09-14 DIAGNOSIS — D242 Benign neoplasm of left breast: Secondary | ICD-10-CM

## 2021-09-14 MED ORDER — DROSPIRENONE-ETHINYL ESTRADIOL 3-0.02 MG PO TABS: 1.0000 | ORAL_TABLET | Freq: Every day | ORAL | 3 refills | Status: DC

## 2021-09-14 NOTE — Progress Notes (Signed)
30 y.o. G0P0000 Single White or Caucasian Not Hispanic or Latino female here for annual exam.  She was switched from the nuvaring to [place] at the end of 4/22 secondary to issues with acne. Her acne is better. She only gives herself a cycle every 3 months.  Period Cycle (Days): 90 Period Duration (Days): 2-3 Period Pattern: Regular Menstrual Flow: Moderate, Light Menstrual Control: Tampon (super) Menstrual Control Change Freq (Hours): 6 Dysmenorrhea: (!) Mild Dysmenorrhea Symptoms: Cramping  Got engaged in 5/22. Live together. No dyspareunia.   Patient's last menstrual period was 08/31/2021 (approximate).          Sexually active: Yes.    The current method of family planning is OCP.   Exercising: Yes.     Light weights cardio Smoker:  no  Health Maintenance: Pap: 09/08/20 WNL HR HPV Neg, 09/14/19 HPV testing negative; 09/04/18 negative, negative hpv;  History of abnormal Pap:  yes 08-29-2017 atypical glandular cells on her pap with negative HPV testing, MMG:  09/11/18 [place] Left Breat for 12o'clock fibroadenoma. BMD:   na Colonoscopy: none TDaP:  2017 Gardasil: complete    reports that she has quit smoking. She has never used smokeless tobacco. She reports current alcohol use of about 1.0 - 2.0 standard drink per week. She reports that she does not use drugs. Works as a Emergency planning/management officer.  History reviewed. No pertinent past medical history.  Past Surgical History:  Procedure Laterality Date   TYMPANOSTOMY TUBE PLACEMENT      Current Outpatient Medications  Medication Sig Dispense Refill   cetirizine (ZYRTEC) 10 MG tablet Take by mouth.     drospirenone-ethinyl estradiol (YAZ) 3-0.02 MG tablet Take 1 tablet by mouth daily. Take continuously, skip the placebo pills 112 tablet 2   fluticasone (FLONASE) 50 MCG/ACT nasal spray Place 2 sprays into both nostrils at bedtime. 16 g 11   No current facility-administered medications for this visit.    Family History  Problem Relation Age of  Onset   Hyperlipidemia Mother    Hypertension Mother    Prostate cancer Father     Review of Systems  All other systems reviewed and are negative.  Exam:   BP 122/68   Pulse 100   Ht 5' 2.5" (1.588 m)   Wt 123 lb (55.8 kg)   LMP 08/31/2021 (Approximate)   SpO2 99%   BMI 22.14 kg/m   Weight change: @WEIGHTCHANGE @ Height:   Height: 5' 2.5" (158.8 cm)  Ht Readings from Last 3 Encounters:  09/14/21 5' 2.5" (1.588 m)  03/04/21 5\' 2"  (1.575 m)  09/08/20 5' 2.25" (1.581 m)    General appearance: alert, cooperative and appears stated age Head: Normocephalic, without obvious abnormality, atraumatic Neck: no adenopathy, supple, symmetrical, trachea midline and thyroid normal to inspection and palpation Lungs: clear to auscultation bilaterally Cardiovascular: regular rate and rhythm Breasts:  in the left breast is a stable 1 cm, smooth, mobile lump at 12-1 o'clock, 1 cm from the areolar region (felt to be fibroadenoma on imaging), no right breast lumps, no skin changes Abdomen: soft, non-tender; non distended,  no masses,  no organomegaly Extremities: extremities normal, atraumatic, no cyanosis or edema Skin: Skin color, texture, turgor normal. No rashes or lesions Lymph nodes: Cervical, supraclavicular, and axillary nodes normal. No abnormal inguinal nodes palpated Neurologic: Grossly normal   Pelvic: External genitalia:  no lesions              Urethra:  normal appearing urethra with no masses, tenderness or  lesions              Bartholins and Skenes: normal                 Vagina: normal appearing vagina with normal color and discharge, no lesions              Cervix: no lesions               Bimanual Exam:  Uterus:  normal size, contour, position, consistency, mobility, non-tender              Adnexa: no mass, fullness, tenderness               Rectovaginal: Confirms               Anus:  normal sphincter tone, no lesions  Gae Dry chaperoned for the exam.   1. Well  woman exam Discussed breast self exam Discussed calcium and vit D intake Labs with primary  2. Encounter for surveillance of contraceptive pills Doing well - drospirenone-ethinyl estradiol (YAZ) 3-0.02 MG tablet; Take 1 tablet by mouth daily. Take continuously, skip the placebo pills  Dispense: 112 tablet; Refill: 3  3. Breast fibroadenoma in female, left Stable compared to prior imaging.

## 2021-09-14 NOTE — Patient Instructions (Signed)

## 2021-11-18 ENCOUNTER — Other Ambulatory Visit (HOSPITAL_COMMUNITY): Payer: Self-pay | Admitting: Orthopedic Surgery

## 2021-11-18 ENCOUNTER — Encounter (HOSPITAL_BASED_OUTPATIENT_CLINIC_OR_DEPARTMENT_OTHER): Payer: Self-pay | Admitting: Orthopedic Surgery

## 2021-11-18 ENCOUNTER — Other Ambulatory Visit: Payer: Self-pay

## 2021-11-26 ENCOUNTER — Ambulatory Visit (HOSPITAL_BASED_OUTPATIENT_CLINIC_OR_DEPARTMENT_OTHER): Payer: 59

## 2021-11-26 ENCOUNTER — Ambulatory Visit (HOSPITAL_BASED_OUTPATIENT_CLINIC_OR_DEPARTMENT_OTHER): Payer: 59 | Admitting: Anesthesiology

## 2021-11-26 ENCOUNTER — Ambulatory Visit (HOSPITAL_BASED_OUTPATIENT_CLINIC_OR_DEPARTMENT_OTHER)
Admission: RE | Admit: 2021-11-26 | Discharge: 2021-11-26 | Disposition: A | Discharge status: Home / Self Care | Payer: 59 | Attending: Orthopedic Surgery | Admitting: Orthopedic Surgery

## 2021-11-26 ENCOUNTER — Encounter (HOSPITAL_BASED_OUTPATIENT_CLINIC_OR_DEPARTMENT_OTHER): Payer: Self-pay | Admitting: Orthopedic Surgery

## 2021-11-26 ENCOUNTER — Other Ambulatory Visit: Payer: Self-pay

## 2021-11-26 ENCOUNTER — Encounter (HOSPITAL_BASED_OUTPATIENT_CLINIC_OR_DEPARTMENT_OTHER)
Admission: RE | Disposition: A | Discharge status: Home / Self Care | Payer: Self-pay | Source: Home / Self Care | Attending: Orthopedic Surgery

## 2021-11-26 DIAGNOSIS — Z87891 Personal history of nicotine dependence: Secondary | ICD-10-CM | POA: Insufficient documentation

## 2021-11-26 DIAGNOSIS — M79671 Pain in right foot: Secondary | ICD-10-CM | POA: Diagnosis not present

## 2021-11-26 DIAGNOSIS — M879 Osteonecrosis, unspecified: Secondary | ICD-10-CM | POA: Diagnosis not present

## 2021-11-26 DIAGNOSIS — M25871 Other specified joint disorders, right ankle and foot: Secondary | ICD-10-CM | POA: Insufficient documentation

## 2021-11-26 DIAGNOSIS — M25872 Other specified joint disorders, left ankle and foot: Secondary | ICD-10-CM

## 2021-11-26 HISTORY — PX: SESAMOIDECTOMY: SHX6418

## 2021-11-26 HISTORY — PX: TENDON REPAIR: SHX5111

## 2021-11-26 LAB — POCT PREGNANCY, URINE: Preg Test, Ur: NEGATIVE

## 2021-11-26 SURGERY — EXCISION, SESAMOID BONE
Anesthesia: General | Site: Foot | Laterality: Right

## 2021-11-26 MED ORDER — VANCOMYCIN HCL 500 MG IV SOLR
INTRAVENOUS | Status: DC | PRN
  Administered 2021-11-26: 500 mg via TOPICAL

## 2021-11-26 MED ORDER — ONDANSETRON HCL 4 MG/2ML IJ SOLN
INTRAMUSCULAR | Status: DC | PRN
Start: 2021-11-26 — End: 2021-11-26
  Administered 2021-11-26: 4 mg via INTRAVENOUS

## 2021-11-26 MED ORDER — MIDAZOLAM HCL 2 MG/2ML IJ SOLN
2.0000 mg | Freq: Once | INTRAMUSCULAR | Status: AC
  Administered 2021-11-26: 2 mg via INTRAVENOUS

## 2021-11-26 MED ORDER — OXYCODONE HCL 5 MG PO TABS: 5.0000 mg | ORAL_TABLET | Freq: Four times a day (QID) | ORAL | 0 refills | Status: AC | PRN

## 2021-11-26 MED ORDER — BUPIVACAINE-EPINEPHRINE (PF) 0.5% -1:200000 IJ SOLN: INTRAMUSCULAR | Status: DC | PRN

## 2021-11-26 MED ORDER — MIDAZOLAM HCL 2 MG/2ML IJ SOLN
INTRAMUSCULAR | Status: AC
  Filled 2021-11-26: qty 2

## 2021-11-26 MED ORDER — FENTANYL CITRATE (PF) 100 MCG/2ML IJ SOLN
INTRAMUSCULAR | Status: AC
  Filled 2021-11-26: qty 2

## 2021-11-26 MED ORDER — ACETAMINOPHEN 500 MG PO TABS
ORAL_TABLET | ORAL | Status: AC
  Filled 2021-11-26: qty 2

## 2021-11-26 MED ORDER — BUPIVACAINE-EPINEPHRINE (PF) 0.5% -1:200000 IJ SOLN
INTRAMUSCULAR | Status: DC | PRN
  Administered 2021-11-26: 30 mL via PERINEURAL

## 2021-11-26 MED ORDER — FENTANYL CITRATE (PF) 100 MCG/2ML IJ SOLN
100.0000 ug | Freq: Once | INTRAMUSCULAR | Status: AC
  Administered 2021-11-26: 100 ug via INTRAVENOUS

## 2021-11-26 MED ORDER — CEFAZOLIN SODIUM-DEXTROSE 2-4 GM/100ML-% IV SOLN
2.0000 g | INTRAVENOUS | Status: AC
  Administered 2021-11-26: 2 g via INTRAVENOUS

## 2021-11-26 MED ORDER — PROPOFOL 10 MG/ML IV BOLUS
INTRAVENOUS | Status: DC | PRN
  Administered 2021-11-26: 200 mg via INTRAVENOUS

## 2021-11-26 MED ORDER — PROPOFOL 500 MG/50ML IV EMUL
INTRAVENOUS | Status: DC | PRN
Start: 2021-11-26 — End: 2021-11-26
  Administered 2021-11-26: 25 ug/kg/min via INTRAVENOUS

## 2021-11-26 MED ORDER — LIDOCAINE 2% (20 MG/ML) 5 ML SYRINGE
INTRAMUSCULAR | Status: DC | PRN
  Administered 2021-11-26: 30 mg via INTRAVENOUS

## 2021-11-26 MED ORDER — SODIUM CHLORIDE 0.9 % IV SOLN: INTRAVENOUS | Status: DC

## 2021-11-26 MED ORDER — DEXAMETHASONE SODIUM PHOSPHATE 10 MG/ML IJ SOLN
INTRAMUSCULAR | Status: DC | PRN
  Administered 2021-11-26: 4 mg via INTRAVENOUS

## 2021-11-26 MED ORDER — LACTATED RINGERS IV SOLN: INTRAVENOUS | Status: DC

## 2021-11-26 MED ORDER — ACETAMINOPHEN 500 MG PO TABS
1000.0000 mg | ORAL_TABLET | Freq: Once | ORAL | Status: AC
  Administered 2021-11-26: 1000 mg via ORAL

## 2021-11-26 MED ORDER — 0.9 % SODIUM CHLORIDE (POUR BTL) OPTIME
TOPICAL | Status: DC | PRN
  Administered 2021-11-26: 150 mL

## 2021-11-26 MED ORDER — CEFAZOLIN SODIUM-DEXTROSE 2-4 GM/100ML-% IV SOLN
INTRAVENOUS | Status: AC
  Filled 2021-11-26: qty 100

## 2021-11-26 MED ORDER — HYDROMORPHONE HCL 1 MG/ML IJ SOLN: 0.2500 mg | INTRAMUSCULAR | Status: DC | PRN

## 2021-11-26 SURGICAL SUPPLY — 64 items
APL PRP STRL LF DISP 70% ISPRP (MISCELLANEOUS) ×1
BANDAGE ESMARK 6X9 LF (GAUZE/BANDAGES/DRESSINGS) ×2 IMPLANT
BLADE MINI RND TIP GREEN BEAV (BLADE) ×3 IMPLANT
BLADE SURG 15 STRL LF DISP TIS (BLADE) ×4 IMPLANT
BLADE SURG 15 STRL SS (BLADE) ×6
BNDG CMPR 9X4 STRL LF SNTH (GAUZE/BANDAGES/DRESSINGS)
BNDG CMPR 9X6 STRL LF SNTH (GAUZE/BANDAGES/DRESSINGS)
BNDG COHESIVE 4X5 TAN ST LF (GAUZE/BANDAGES/DRESSINGS) IMPLANT
BNDG CONFORM 3 STRL LF (GAUZE/BANDAGES/DRESSINGS) ×1 IMPLANT
BNDG ELASTIC 4X5.8 VLCR STR LF (GAUZE/BANDAGES/DRESSINGS) ×1 IMPLANT
BNDG ESMARK 4X9 LF (GAUZE/BANDAGES/DRESSINGS) IMPLANT
BNDG ESMARK 6X9 LF (GAUZE/BANDAGES/DRESSINGS)
BOOT STEPPER DURA MED (SOFTGOODS) ×1 IMPLANT
CHLORAPREP W/TINT 26 (MISCELLANEOUS) ×3 IMPLANT
CORD BIPOLAR FORCEPS 12FT (ELECTRODE) ×1 IMPLANT
COVER BACK TABLE 60X90IN (DRAPES) ×3 IMPLANT
CUFF TOURN SGL QUICK 24 (TOURNIQUET CUFF)
CUFF TOURN SGL QUICK 34 (TOURNIQUET CUFF)
CUFF TRNQT CYL 24X4X16.5-23 (TOURNIQUET CUFF) IMPLANT
CUFF TRNQT CYL 34X4.125X (TOURNIQUET CUFF) IMPLANT
DRAPE EXTREMITY T 121X128X90 (DISPOSABLE) ×3 IMPLANT
DRAPE OEC MINIVIEW 54X84 (DRAPES) ×1 IMPLANT
DRAPE U-SHAPE 47X51 STRL (DRAPES) ×3 IMPLANT
DRSG MEPITEL 4X7.2 (GAUZE/BANDAGES/DRESSINGS) ×3 IMPLANT
DRSG PAD ABDOMINAL 8X10 ST (GAUZE/BANDAGES/DRESSINGS) ×3 IMPLANT
ELECT REM PT RETURN 9FT ADLT (ELECTROSURGICAL)
ELECTRODE REM PT RTRN 9FT ADLT (ELECTROSURGICAL) IMPLANT
GAUZE SPONGE 4X4 12PLY STRL (GAUZE/BANDAGES/DRESSINGS) ×3 IMPLANT
GLOVE SRG 8 PF TXTR STRL LF DI (GLOVE) ×4 IMPLANT
GLOVE SURG ENC MOIS LTX SZ8 (GLOVE) ×3 IMPLANT
GLOVE SURG LTX SZ8 (GLOVE) ×2 IMPLANT
GLOVE SURG POLYISO LF SZ7 (GLOVE) ×2 IMPLANT
GLOVE SURG UNDER POLY LF SZ7 (GLOVE) ×4 IMPLANT
GLOVE SURG UNDER POLY LF SZ8 (GLOVE) ×2
GOWN STRL REUS W/ TWL LRG LVL3 (GOWN DISPOSABLE) ×2 IMPLANT
GOWN STRL REUS W/ TWL XL LVL3 (GOWN DISPOSABLE) ×4 IMPLANT
GOWN STRL REUS W/TWL LRG LVL3 (GOWN DISPOSABLE) ×4
GOWN STRL REUS W/TWL XL LVL3 (GOWN DISPOSABLE) ×2
NDL HYPO 25X1 1.5 SAFETY (NEEDLE) IMPLANT
NEEDLE HYPO 22GX1.5 SAFETY (NEEDLE) IMPLANT
NEEDLE HYPO 25X1 1.5 SAFETY (NEEDLE) ×2 IMPLANT
NS IRRIG 1000ML POUR BTL (IV SOLUTION) ×3 IMPLANT
PACK BASIN DAY SURGERY FS (CUSTOM PROCEDURE TRAY) ×3 IMPLANT
PAD CAST 4YDX4 CTTN HI CHSV (CAST SUPPLIES) ×2 IMPLANT
PADDING CAST COTTON 4X4 STRL (CAST SUPPLIES) ×2
PENCIL SMOKE EVACUATOR (MISCELLANEOUS) IMPLANT
SANITIZER HAND PURELL 535ML FO (MISCELLANEOUS) ×3 IMPLANT
SHEET MEDIUM DRAPE 40X70 STRL (DRAPES) ×3 IMPLANT
SLEEVE SCD COMPRESS KNEE MED (STOCKING) ×3 IMPLANT
SPLINT FAST PLASTER 5X30 (CAST SUPPLIES) ×5
SPLINT PLASTER CAST FAST 5X30 (CAST SUPPLIES) IMPLANT
SPONGE T-LAP 18X18 ~~LOC~~+RFID (SPONGE) ×3 IMPLANT
STOCKINETTE 6  STRL (DRAPES) ×2
STOCKINETTE 6 STRL (DRAPES) ×2 IMPLANT
SUT ETHILON 3 0 PS 1 (SUTURE) ×3 IMPLANT
SUT MNCRL AB 3-0 PS2 18 (SUTURE) ×3 IMPLANT
SUT VIC AB 2-0 SH 27 (SUTURE)
SUT VIC AB 2-0 SH 27XBRD (SUTURE) IMPLANT
SUT VICRYL 0 SH 27 (SUTURE) IMPLANT
SUT VICRYL 0 UR6 27IN ABS (SUTURE) ×3 IMPLANT
SYR BULB EAR ULCER 3OZ GRN STR (SYRINGE) ×3 IMPLANT
SYR CONTROL 10ML LL (SYRINGE) IMPLANT
TOWEL GREEN STERILE FF (TOWEL DISPOSABLE) ×3 IMPLANT
UNDERPAD 30X36 HEAVY ABSORB (UNDERPADS AND DIAPERS) ×3 IMPLANT

## 2021-11-26 NOTE — Discharge Instructions (Addendum)
Wylene Simmer, MD [facility]  Please read the following information regarding your care after surgery.  Medications  You only need a prescription for the narcotic pain medicine (ex. oxycodone, Percocet, Norco).  All of the other medicines listed below are available over the counter. X Aleve 2 pills twice a day for the first 3 days after surgery. X acetominophen (Tylenol) 650 mg every 4-6 hours as you need for minor to moderate pain X oxycodone as prescribed for severe pain  Narcotic pain medicine (ex. oxycodone, Percocet, Vicodin) will cause constipation.  To prevent this problem, take the following medicines while you are taking any pain medicine. X docusate sodium (Colace) 100 mg twice a day X senna (Senokot) 2 tablets twice a day  Weight Bearing X Bear weight when you are able on your operated leg or foot in the CAM boot.  Cast / Splint / Dressing X Keep your splint, cast or dressing clean and dry.  Dont put anything (coat hanger, pencil, etc) down inside of it.  If it gets damp, use a hair dryer on the cool setting to dry it.  If it gets soaked, call the office to schedule an appointment for a cast change.  After your dressing, cast or splint is removed; you may shower, but do not soak or scrub the wound.  Allow the water to run over it, and then gently pat it dry.  Swelling It is normal for you to have swelling where you had surgery.  To reduce swelling and pain, keep your toes above your nose for at least 3 days after surgery.  It may be necessary to keep your foot or leg elevated for several weeks.  If it hurts, it should be elevated.  Follow Up Call my office at [phone] when you are discharged from the hospital or surgery center to schedule an appointment to be seen two weeks after surgery.  Call my office at [phone] if you develop a fever >101.5 F, nausea, vomiting, bleeding from the surgical site or severe pain.     Post Anesthesia Home Care  Instructions  Activity: Get plenty of rest for the remainder of the day. A responsible individual must stay with you for 24 hours following the procedure.  For the next 24 hours, DO NOT: -Drive a car -Paediatric nurse -Drink alcoholic beverages -Take any medication unless instructed by your physician -Make any legal decisions or sign important papers.  Meals: Start with liquid foods such as gelatin or soup. Progress to regular foods as tolerated. Avoid greasy, spicy, heavy foods. If nausea and/or vomiting occur, drink only clear liquids until the nausea and/or vomiting subsides. Call your physician if vomiting continues.  Special Instructions/Symptoms: Your throat may feel dry or sore from the anesthesia or the breathing tube placed in your throat during surgery. If this causes discomfort, gargle with warm salt water. The discomfort should disappear within 24 hours.  If you had a scopolamine patch placed behind your ear for the management of post- operative nausea and/or vomiting:  1. The medication in the patch is effective for 72 hours, after which it should be removed.  Wrap patch in a tissue and discard in the trash. Wash hands thoroughly with soap and water. 2. You may remove the patch earlier than 72 hours if you experience unpleasant side effects which may include dry mouth, dizziness or visual disturbances. 3. Avoid touching the patch. Wash your hands with soap and water after contact with the patch.

## 2021-11-26 NOTE — Anesthesia Procedure Notes (Signed)
Procedure Name: LMA Insertion Date/Time: 11/26/2021 12:57 PM Performed by: Signe Colt, CRNA Pre-anesthesia Checklist: Patient identified, Emergency Drugs available, Suction available and Patient being monitored Patient Re-evaluated:Patient Re-evaluated prior to induction Oxygen Delivery Method: Circle System Utilized Preoxygenation: Pre-oxygenation with 100% oxygen Induction Type: IV induction Ventilation: Mask ventilation without difficulty LMA: LMA inserted LMA Size: 4.0 Number of attempts: 1 Airway Equipment and Method: bite block Placement Confirmation: positive ETCO2 Tube secured with: Tape Dental Injury: Teeth and Oropharynx as per pre-operative assessment

## 2021-11-26 NOTE — Progress Notes (Signed)
Assisted Dr. Oren Bracket with right, ultrasound guided, popliteal block. Side rails up, monitors on throughout procedure. See vital signs in flow sheet. Tolerated Procedure well.

## 2021-11-26 NOTE — H&P (Signed)
Anne Franco is an 31 y.o. female.   Chief Complaint: Right foot pain HPI: 31 year old female without significant past medical history has a painful right forefoot for the last year.  Her pain started while she was running.  Signs and symptoms are consistent with lateral sesamoiditis.  An MRI shows avascular necrosis with fragmentation.  She has failed nonoperative treatment to date including oral anti-inflammatories, immobilization and shoewear modifications.  She presents now for surgical treatment.  History reviewed. No pertinent past medical history.  Past Surgical History:  Procedure Laterality Date   TYMPANOSTOMY TUBE PLACEMENT      Family History  Problem Relation Age of Onset   Hyperlipidemia Mother    Hypertension Mother    Prostate cancer Father    Social History:  reports that she has quit smoking. She has never used smokeless tobacco. She reports current alcohol use of about 1.0 - 2.0 standard drink per week. She reports that she does not use drugs.  Allergies: No Known Allergies  Medications Prior to Admission  Medication Sig Dispense Refill   drospirenone-ethinyl estradiol (YAZ) 3-0.02 MG tablet Take 1 tablet by mouth daily. Take continuously, skip the placebo pills 112 tablet 3   fluticasone (FLONASE) 50 MCG/ACT nasal spray Place 2 sprays into both nostrils at bedtime. 16 g 11   cetirizine (ZYRTEC) 10 MG tablet Take by mouth.      Results for orders placed or performed during the hospital encounter of 16-Dec-2021 (from the past 48 hour(s))  Pregnancy, urine POC     Status: None   Collection Time: 16-Dec-2021 10:42 AM  Result Value Ref Range   Preg Test, Ur NEGATIVE NEGATIVE    Comment:        THE SENSITIVITY OF THIS METHODOLOGY IS >24 mIU/mL    DG MINI C-ARM IMAGE ONLY  Result Date: 16-Dec-2021 There is no interpretation for this exam.  This order is for images obtained during a surgical procedure.  Please See "Surgeries" Tab for more information regarding the  procedure.    Review of Systems no recent fever, chills, nausea, vomiting or changes in her appetite  Blood pressure 112/76, pulse 73, temperature 97.7 F (36.5 C), temperature source Oral, resp. rate 14, height 5\' 2"  (1.575 m), weight 57.3 kg, last menstrual period 10/08/2021, SpO2 100 %. Physical Exam  Well-nourished well-developed young woman in no apparent distress.  Alert and oriented x4.  Normal mood and affect.  Extraocular motions are intact.  Respirations are unlabored.  Gait is antalgic to the right.  Tender to palpation beneath the lateral sesamoid.  Skin is healthy and intact.  5 out of 5 strength in plantarflexion and dorsiflexion of the hallux at the MP and IP joints.  No lymphadenopathy.  Pulses are palpable in the foot.  Intact sensibility to light touch dorsally and plantarly at the forefoot.   Assessment/Plan Right lateral sesamoid avascular necrosis -to the operating room today for excision of the lateral sesamoid and repair of the flexor hallucis brevis tendon.  The risks and benefits of the alternative treatment options have been discussed in detail.  The patient wishes to proceed with surgery and specifically understands risks of bleeding, infection, nerve damage, blood clots, need for additional surgery, amputation and death.   Wylene Simmer, MD 2021-12-16, 12:34 PM

## 2021-11-26 NOTE — Anesthesia Procedure Notes (Addendum)
Anesthesia Regional Block: Popliteal block   Pre-Anesthetic Checklist: , timeout performed,  Correct Patient, Correct Site, Correct Laterality,  Correct Procedure, Correct Position, site marked,  Risks and benefits discussed,  Pre-op evaluation,  At surgeon's request and post-op pain management  Laterality: Right  Prep: Maximum Sterile Barrier Precautions used, chloraprep       Needles:  Injection technique: Single-shot  Needle Type: Echogenic Stimulator Needle     Needle Length: 9cm  Needle Gauge: 21     Additional Needles:   Procedures:,,,, ultrasound used (permanent image in chart),,    Narrative:  Start time: 11/26/2021 11:26 AM End time: 11/26/2021 11:36 AM Injection made incrementally with aspirations every 5 mL.  Performed by: Personally  Anesthesiologist: Roderic Palau, MD

## 2021-11-26 NOTE — Anesthesia Postprocedure Evaluation (Signed)
Anesthesia Post Note  Patient: Anne Franco  Procedure(s) Performed: Right lateral sesamoid excision (Right: Foot) Repair of flexor hallucis brevis tendon (Right: Foot)     Patient location during evaluation: PACU Anesthesia Type: General and Regional Level of consciousness: awake and alert Pain management: pain level controlled Vital Signs Assessment: post-procedure vital signs reviewed and stable Respiratory status: spontaneous breathing, nonlabored ventilation and respiratory function stable Cardiovascular status: blood pressure returned to baseline and stable Postop Assessment: no apparent nausea or vomiting Anesthetic complications: no   No notable events documented.  Last Vitals:  Vitals:   11/26/21 1430 11/26/21 1445  BP: (!) 123/94 137/90  Pulse: 97 84  Resp: 15 14  Temp:  36.4 C  SpO2: 100% 100%    Last Pain:  Vitals:   11/26/21 1445  TempSrc:   PainSc: 0-No pain                 Matrice Herro,W. EDMOND

## 2021-11-26 NOTE — Transfer of Care (Signed)
Immediate Anesthesia Transfer of Care Note  Patient: Anne Franco  Procedure(s) Performed: Right lateral sesamoid excision (Right: Foot) Repair of flexor hallucis brevis tendon (Right: Foot)  Patient Location: PACU  Anesthesia Type:General and Regional  Level of Consciousness: drowsy and patient cooperative  Airway & Oxygen Therapy: Patient Spontanous Breathing and Patient connected to face mask oxygen  Post-op Assessment: Report given to RN and Post -op Vital signs reviewed and stable  Post vital signs: Reviewed and stable  Last Vitals:  Vitals Value Taken Time  BP    Temp    Pulse 84 11/26/21 1400  Resp 15 11/26/21 1400  SpO2 100 % 11/26/21 1400  Vitals shown include unvalidated device data.  Last Pain:  Vitals:   11/26/21 1057  TempSrc: Oral  PainSc: 0-No pain      Patients Stated Pain Goal: 5 (26/94/85 4627)  Complications: No notable events documented.

## 2021-11-26 NOTE — Op Note (Signed)
11/26/2021  2:07 PM  PATIENT:  Anne Franco  31 y.o. female  PRE-OPERATIVE DIAGNOSIS:  sesamoiditis and avascular necrosis of the right hallux lateral sesamoid  POST-OPERATIVE DIAGNOSIS:  same  Procedure(s):  Right hallux lateral sesamoidectomy  Repair of flexor hallucis brevis tendon Right foot AP and lateral xrays  SURGEON:  Wylene Simmer, MD  ASSISTANT: none  ANESTHESIA:   General, regional  EBL:  minimal   TOURNIQUET:   Total Tourniquet Time Documented: Thigh (Right) - 42 minutes Total: Thigh (Right) - 42 minutes  COMPLICATIONS:  None apparent  DISPOSITION:  Extubated, awake and stable to recovery.  INDICATION FOR PROCEDURE: The patient is a 31 year old female with a history of right forefoot sesamoiditis symptoms for over a year.  She has failed nonoperative treatment including activity modification, shoewear modification and immobilization.  An MRI reveals avascular necrosis of the lateral sesamoid.  She presents now for surgical treatment of this painful and limiting condition.  The risks and benefits of the alternative treatment options have been discussed in detail.  The patient wishes to proceed with surgery and specifically understands risks of bleeding, infection, nerve damage, blood clots, need for additional surgery, amputation and death.   PROCEDURE IN DETAIL:  After pre operative consent was obtained, and the correct operative site was identified, the patient was brought to the operating room and placed supine on the OR table.  Anesthesia was administered.  Pre-operative antibiotics were administered.  A surgical timeout was taken.  The right lower extremity was prepped and draped in standard sterile fashion with a tourniquet around the thigh.  The extremity was elevated and the tourniquet was inflated to 250 mmHg.  A longitudinal incision was made between the weightbearing fat pad of the first and second metatarsal heads.  Dissection was carried sharply down  through the subcutaneous tissues.  The lateral digital nerve was identified.  It was mobilized and protected throughout the case.  The lateral sesamoid was identified within the flexor hallucis brevis tendon.  It was marked with a needle.  AP and lateral radiographs confirmed that the needle was indeed in the lateral sesamoid.  The [facility] tendon was then split longitudinally over the sesamoid.  Subperiosteal dissection was then carried circumferentially around the sesamoid until it was mobile enough to grasp with a rondure.  The sesamoid was then removed in its entirety.  AP and lateral radiographs were then repeated showing complete removal of the lateral sesamoid.  The wound was then irrigated copiously.  The defect in the FHB tendon was repaired with simple sutures of 0 Vicryl.    The wound was again irrigated copiously and sprinkled with vancomycin powder.  The neurovascular bundle was again identified and appeared healthy and intact.  The skin incision was closed with horizontal mattress sutures of 3-0 nylon.  Sterile dressings were applied followed by a hallux spica splint and a cam boot.  The tourniquet was released after application of the dressings.  The patient was awakened from anesthesia and transported to the recovery room in stable condition.   FOLLOW UP PLAN: Weightbearing as tolerated on the heel in a cam boot.  Follow-up in the office in 2 weeks for suture removal.  Plan taping of the hallux for a month postop.  Then wean out of the cam boot into regular shoes.   RADIOGRAPHS: AP and lateral radiographs of the right foot are obtained intraoperatively before and after lateral sesamoid excision.  The lateral sesamoid is completely excised.  No other acute  injuries are noted.

## 2021-11-26 NOTE — Anesthesia Preprocedure Evaluation (Addendum)
Anesthesia Evaluation  Patient identified by MRN, date of birth, ID band Patient awake    Reviewed: Allergy & Precautions, H&P , NPO status , Patient's Chart, lab work & pertinent test results  Airway Mallampati: II  TM Distance: >3 FB Neck ROM: Full    Dental no notable dental hx. (+) Teeth Intact, Dental Advisory Given   Pulmonary neg pulmonary ROS, former smoker,    Pulmonary exam normal breath sounds clear to auscultation       Cardiovascular negative cardio ROS   Rhythm:Regular Rate:Normal     Neuro/Psych negative neurological ROS  negative psych ROS   GI/Hepatic negative GI ROS, Neg liver ROS,   Endo/Other  negative endocrine ROS  Renal/GU negative Renal ROS  negative genitourinary   Musculoskeletal   Abdominal   Peds  Hematology negative hematology ROS (+)   Anesthesia Other Findings   Reproductive/Obstetrics negative OB ROS                            Anesthesia Physical Anesthesia Plan  ASA: 1  Anesthesia Plan: General   Post-op Pain Management: Regional block and Tylenol PO (pre-op)   Induction: Intravenous  PONV Risk Score and Plan: 4 or greater and Ondansetron, Dexamethasone and Midazolam  Airway Management Planned: LMA  Additional Equipment:   Intra-op Plan:   Post-operative Plan: Extubation in OR  Informed Consent: I have reviewed the patients History and Physical, chart, labs and discussed the procedure including the risks, benefits and alternatives for the proposed anesthesia with the patient or authorized representative who has indicated his/her understanding and acceptance.     Dental advisory given  Plan Discussed with: CRNA  Anesthesia Plan Comments:         Anesthesia Quick Evaluation

## 2021-11-27 ENCOUNTER — Encounter (HOSPITAL_BASED_OUTPATIENT_CLINIC_OR_DEPARTMENT_OTHER): Payer: Self-pay | Admitting: Orthopedic Surgery

## 2022-01-28 DIAGNOSIS — M25871 Other specified joint disorders, right ankle and foot: Secondary | ICD-10-CM | POA: Diagnosis not present

## 2022-03-30 DIAGNOSIS — R7989 Other specified abnormal findings of blood chemistry: Secondary | ICD-10-CM | POA: Diagnosis not present

## 2022-03-30 DIAGNOSIS — Z0000 Encounter for general adult medical examination without abnormal findings: Secondary | ICD-10-CM | POA: Diagnosis not present

## 2022-09-09 DIAGNOSIS — L709 Acne, unspecified: Secondary | ICD-10-CM | POA: Diagnosis not present

## 2022-09-09 DIAGNOSIS — R4189 Other symptoms and signs involving cognitive functions and awareness: Secondary | ICD-10-CM | POA: Diagnosis not present

## 2022-09-09 DIAGNOSIS — R41840 Attention and concentration deficit: Secondary | ICD-10-CM | POA: Diagnosis not present

## 2022-09-09 DIAGNOSIS — Z23 Encounter for immunization: Secondary | ICD-10-CM | POA: Diagnosis not present

## 2022-09-21 ENCOUNTER — Other Ambulatory Visit: Payer: Self-pay | Admitting: Obstetrics and Gynecology

## 2022-09-21 DIAGNOSIS — Z3041 Encounter for surveillance of contraceptive pills: Secondary | ICD-10-CM

## 2022-09-21 NOTE — Telephone Encounter (Signed)
Medication refill request: Lexine Baton  Last AEX:  09-14-21 JJ  Next AEX: 12-01-22 Last MMG (if hormonal medication request): n/a Refill authorized: Today, please advise.   Medication pended for #112, 0RF. Please refill if appropriate.

## 2022-11-04 DIAGNOSIS — J3089 Other allergic rhinitis: Secondary | ICD-10-CM | POA: Diagnosis not present

## 2022-11-04 DIAGNOSIS — J309 Allergic rhinitis, unspecified: Secondary | ICD-10-CM | POA: Diagnosis not present

## 2022-11-25 NOTE — Progress Notes (Deleted)
32 y.o. G0P0000 Single White or Caucasian Not Hispanic or Latino female here for annual exam.      No LMP recorded. (Menstrual status: Other).          Sexually active: {yes no:314532}  The current method of family planning is {contraception:315051}.    Exercising: {yes no:314532}  {types:19826} Smoker:  {YES [id number]  Health Maintenance: Pap: 09/08/20 WNL HR HPV Neg, 09/14/19 HPV testing negative; 09/04/18 negative, negative hpv;   History of abnormal Pap:  yes 08-29-2017 atypical glandular cells on her pap with negative HPV testing,  MMG: 09/11/18 [place] Left Breat for 12o'clock fibroadenoma. BMD:   na Colonoscopy: none TDaP:  2017 Gardasil: complete    reports that she has quit smoking. She has never used smokeless tobacco. She reports current alcohol use of about 1.0 - 2.0 standard drink of alcohol per week. She reports that she does not use drugs.  No past medical history on file.  Past Surgical History:  Procedure Laterality Date   SESAMOIDECTOMY Right 11/26/2021   Procedure: Right lateral sesamoid excision;  Surgeon: Wylene Simmer, MD;  Location: [place];  Service: Orthopedics;  Laterality: Right;   TENDON REPAIR Right 11/26/2021   Procedure: Repair of flexor hallucis brevis tendon;  Surgeon: Wylene Simmer, MD;  Location: [place];  Service: Orthopedics;  Laterality: Right;   TYMPANOSTOMY TUBE PLACEMENT      Current Outpatient Medications  Medication Sig Dispense Refill   cetirizine (ZYRTEC) 10 MG tablet Take by mouth.     fluticasone (FLONASE) 50 MCG/ACT nasal spray Place 2 sprays into both nostrils at bedtime. 16 g 11   NIKKI 3-0.02 MG tablet TAKE 1 TABLET BY MOUTH EVERY DAY TAKE CONTINUOUSLY,SKIP PLACEBO 112 tablet 0   No current facility-administered medications for this visit.    Family History  Problem Relation Age of Onset   Hyperlipidemia Mother    Hypertension Mother    Prostate cancer Father     Review of Systems  Exam:    There were no vitals taken for this visit.  Weight change: '@WEIGHTCHANGE'$ @ Height:      Ht Readings from Last 3 Encounters:  11/26/21 '5\' 2"'$  (1.575 m)  09/14/21 5' 2.5" (1.588 m)  03/04/21 '5\' 2"'$  (1.575 m)    General appearance: alert, cooperative and appears stated age Head: Normocephalic, without obvious abnormality, atraumatic Neck: no adenopathy, supple, symmetrical, trachea midline and thyroid {CHL AMB PHY EX THYROID NORM [phone]::"normal to inspection and palpation"} Lungs: clear to auscultation bilaterally Cardiovascular: regular rate and rhythm Breasts: {Exam; breast:13139::"normal appearance, no masses or tenderness"} Abdomen: soft, non-tender; non distended,  no masses,  no organomegaly Extremities: extremities normal, atraumatic, no cyanosis or edema Skin: Skin color, texture, turgor normal. No rashes or lesions Lymph nodes: Cervical, supraclavicular, and axillary nodes normal. No abnormal inguinal nodes palpated Neurologic: Grossly normal   Pelvic: External genitalia:  no lesions              Urethra:  normal appearing urethra with no masses, tenderness or lesions              Bartholins and Skenes: normal                 Vagina: normal appearing vagina with normal color and discharge, no lesions              Cervix: {CHL AMB PHY EX CERVIX NORM [phone]::"no lesions"}  Bimanual Exam:  Uterus:  {CHL AMB PHY EX UTERUS NORM DEFAULT:[phone]::"normal size, contour, position, consistency, mobility, non-tender"}              Adnexa: {CHL AMB PHY EX ADNEXA NO MASS DEFAULT:[phone]::"no mass, fullness, tenderness"}               Rectovaginal: Confirms               Anus:  normal sphincter tone, no lesions  *** chaperoned for the exam.  A:  Well Woman with normal exam  P:

## 2022-12-01 ENCOUNTER — Ambulatory Visit: Payer: Self-pay | Admitting: Obstetrics and Gynecology

## 2023-01-05 NOTE — Progress Notes (Signed)
32 y.o. G0P0000 Single White or Caucasian Not Hispanic or Latino female here for annual exam.  On yaz, gives herself a cycle q 3 months. She states that she is having break through bleeding on the pills and would like to go back to the nuvaring.  Period Duration (Days): 4 Period Pattern: (!) Irregular Menstrual Flow: Moderate Menstrual Control: Tampon Menstrual Control Change Freq (Hours): 6 Dysmenorrhea: None  Engaged. No wedding date is set yet. Moved to [facility].   No bowel or bladder issues.  Patient's last menstrual period was 12/16/2022.          Sexually active: Yes.    The current method of family planning is OCP (estrogen/progesterone).    Exercising: No.   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 BMD:   na Colonoscopy: none TDaP:  2017 Gardasil: complete    reports that she has quit smoking. Her smoking use included cigarettes. She has never used smokeless tobacco. She reports current alcohol use of about 1.0 - 2.0 standard drink of alcohol per week. She reports that she does not use drugs. She is working in [employer].   No past medical history on file.  Past Surgical History:  Procedure Laterality Date   SESAMOIDECTOMY Right 11/26/2021   Procedure: Right lateral sesamoid excision;  Surgeon: Wylene Simmer, MD;  Location: [place];  Service: Orthopedics;  Laterality: Right;   TENDON REPAIR Right 11/26/2021   Procedure: Repair of flexor hallucis brevis tendon;  Surgeon: Wylene Simmer, MD;  Location: [place];  Service: Orthopedics;  Laterality: Right;   TYMPANOSTOMY TUBE PLACEMENT      Current Outpatient Medications  Medication Sig Dispense Refill   cetirizine (ZYRTEC) 10 MG tablet Take by  mouth.     fluticasone (FLONASE) 50 MCG/ACT nasal spray Place 2 sprays into both nostrils at bedtime. 16 g 11   NIKKI 3-0.02 MG tablet TAKE 1 TABLET BY MOUTH EVERY DAY TAKE CONTINUOUSLY,SKIP PLACEBO 28 tablet 0   No current facility-administered medications for this visit.    Family History  Problem Relation Age of Onset   Hyperlipidemia Mother    Hypertension Mother    Prostate cancer Father     Review of Systems  All other systems reviewed and are negative.   Exam:   BP 110/68   Pulse 75   Ht '5\' 2"'$  (1.575 m)   Wt 124 lb 12.8 oz (56.6 kg)   LMP 12/16/2022   SpO2 100%   BMI 22.83 kg/m   Weight change: '@WEIGHTCHANGE'$ @ Height:   Height: '5\' 2"'$  (157.5 cm)  Ht Readings from Last 3 Encounters:  01/14/23 '5\' 2"'$  (1.575 m)  11/26/21 '5\' 2"'$  (1.575 m)  09/14/21 5' 2.5" (1.588 m)    General appearance: alert, cooperative and appears stated age Head: Normocephalic, without obvious abnormality, atraumatic Neck: no adenopathy, supple, symmetrical, trachea midline and thyroid normal to inspection and palpation Lungs: clear to auscultation bilaterally Cardiovascular: regular rate and rhythm Breasts:  stable 1 cm, smooth mobile lump in the left breast at 12-1 o'clock, 1 cm from the areolar region. No other lumps, no skin changes.  Abdomen: soft, non-tender; non distended,  no masses,  no organomegaly Extremities: extremities normal, atraumatic, no cyanosis or edema Skin: Skin color,  texture, turgor normal. No rashes or lesions Lymph nodes: Cervical, supraclavicular, and axillary nodes normal. No abnormal inguinal nodes palpated Neurologic: Grossly normal   Pelvic: External genitalia:  no lesions              Urethra:  normal appearing urethra with no masses, tenderness or lesions              Bartholins and Skenes: normal                 Vagina: normal appearing vagina with normal color and discharge, no lesions              Cervix: no lesions and + ectropion               Bimanual  Exam:  Uterus:  normal size, contour, position, consistency, mobility, non-tender              Adnexa: no mass, fullness, tenderness               Rectovaginal: Confirms               Anus:  normal sphincter tone, no lesions  Gae Dry, CMA chaperoned for the exam.  1. Well woman exam Discussed breast self exam Discussed calcium and vit D intake No labs today  2. Screening for cervical cancer - Cytology - PAP  3. Breast fibroadenoma in female, left Stable  4. General counseling and advice on female contraception Wants to change to the nuvaring.  - etonogestrel-ethinyl estradiol (NUVARING) 0.12-0.015 MG/24HR vaginal ring; Place 1 each vaginally every 28 (twenty-eight) days. Insert vaginally and leave in place for 3 consecutive weeks, then remove for 1 week.  Dispense: 3 each; Refill: 4

## 2023-01-07 ENCOUNTER — Other Ambulatory Visit: Payer: Self-pay | Admitting: Obstetrics and Gynecology

## 2023-01-07 DIAGNOSIS — Z3041 Encounter for surveillance of contraceptive pills: Secondary | ICD-10-CM

## 2023-01-07 NOTE — Telephone Encounter (Signed)
Medication refill request: nikki Last AEX:  09-14-21 Next AEX: 01-14-23 Last MMG (if hormonal medication request): 09-11-18 left breast u/s Refill authorized: please approve 1 pack if appropriate. Pharmacy note placed no refills after this until patient comes for aex. Patient cancelled aex 11/2022

## 2023-01-14 ENCOUNTER — Other Ambulatory Visit (HOSPITAL_COMMUNITY)
Admission: RE | Admit: 2023-01-14 | Discharge: 2023-01-14 | Disposition: A | Discharge status: Home / Self Care | Payer: BLUE CROSS/BLUE SHIELD | Source: Ambulatory Visit | Attending: Obstetrics and Gynecology | Admitting: Obstetrics and Gynecology

## 2023-01-14 ENCOUNTER — Ambulatory Visit (INDEPENDENT_AMBULATORY_CARE_PROVIDER_SITE_OTHER): Payer: BLUE CROSS/BLUE SHIELD | Admitting: Obstetrics and Gynecology

## 2023-01-14 ENCOUNTER — Encounter: Payer: Self-pay | Admitting: Obstetrics and Gynecology

## 2023-01-14 VITALS — BP 110/68 | HR 75 | Ht 62.0 in | Wt 124.8 lb

## 2023-01-14 DIAGNOSIS — Z124 Encounter for screening for malignant neoplasm of cervix: Secondary | ICD-10-CM | POA: Diagnosis not present

## 2023-01-14 DIAGNOSIS — D242 Benign neoplasm of left breast: Secondary | ICD-10-CM

## 2023-01-14 DIAGNOSIS — Z01419 Encounter for gynecological examination (general) (routine) without abnormal findings: Secondary | ICD-10-CM | POA: Diagnosis not present

## 2023-01-14 DIAGNOSIS — Z3009 Encounter for other general counseling and advice on contraception: Secondary | ICD-10-CM

## 2023-01-14 MED ORDER — ETONOGESTREL-ETHINYL ESTRADIOL 0.12-0.015 MG/24HR VA RING
1.0000 | VAGINAL_RING | VAGINAL | 4 refills | Status: AC
Start: 2023-01-14 — End: ?

## 2023-01-14 NOTE — Patient Instructions (Signed)

## 2023-01-18 LAB — CYTOLOGY - PAP
Comment: NEGATIVE
Diagnosis: NEGATIVE
High risk HPV: NEGATIVE

## 2023-03-18 DIAGNOSIS — K5909 Other constipation: Secondary | ICD-10-CM | POA: Diagnosis not present

## 2023-03-24 DIAGNOSIS — M2550 Pain in unspecified joint: Secondary | ICD-10-CM | POA: Diagnosis not present

## 2023-03-24 DIAGNOSIS — M5489 Other dorsalgia: Secondary | ICD-10-CM | POA: Diagnosis not present

## 2023-03-28 DIAGNOSIS — Z79899 Other long term (current) drug therapy: Secondary | ICD-10-CM | POA: Diagnosis not present

## 2023-03-28 DIAGNOSIS — R799 Abnormal finding of blood chemistry, unspecified: Secondary | ICD-10-CM | POA: Diagnosis not present

## 2023-03-28 DIAGNOSIS — R7989 Other specified abnormal findings of blood chemistry: Secondary | ICD-10-CM | POA: Diagnosis not present

## 2023-03-28 DIAGNOSIS — R5383 Other fatigue: Secondary | ICD-10-CM | POA: Diagnosis not present
